# Patient Record
Sex: Male | Born: 1980 | Hispanic: Yes | Marital: Single | Smoking: Never smoker
Health system: Southern US, Community
[De-identification: ages and names within clinical notes are randomized; demographics above are authoritative.]

## PROBLEM LIST (undated history)

## (undated) DIAGNOSIS — E119 Type 2 diabetes mellitus without complications: Secondary | ICD-10-CM

---

## 2006-12-08 ENCOUNTER — Emergency Department: Payer: Self-pay | Admitting: Emergency Medicine

## 2009-01-22 ENCOUNTER — Emergency Department: Payer: Self-pay | Admitting: Emergency Medicine

## 2009-01-26 ENCOUNTER — Emergency Department: Payer: Self-pay | Admitting: Internal Medicine

## 2014-08-01 ENCOUNTER — Ambulatory Visit: Payer: Self-pay | Admitting: Physician Assistant

## 2014-08-01 LAB — CBC WITH DIFFERENTIAL/PLATELET
BASOS ABS: 0 10*3/uL (ref 0.0–0.1)
Basophil %: 0.7 %
Eosinophil #: 0.1 10*3/uL (ref 0.0–0.7)
Eosinophil %: 1.3 %
HCT: 48.3 % (ref 40.0–52.0)
HGB: 16.8 g/dL (ref 13.0–18.0)
Lymphocyte #: 1.7 10*3/uL (ref 1.0–3.6)
Lymphocyte %: 34.1 %
MCH: 29.5 pg (ref 26.0–34.0)
MCHC: 34.7 g/dL (ref 32.0–36.0)
MCV: 85 fL (ref 80–100)
Monocyte #: 0.4 x10 3/mm (ref 0.2–1.0)
Monocyte %: 6.9 %
NEUTROS PCT: 57 %
Neutrophil #: 2.9 10*3/uL (ref 1.4–6.5)
Platelet: 188 10*3/uL (ref 150–440)
RBC: 5.68 10*6/uL (ref 4.40–5.90)
RDW: 12.3 % (ref 11.5–14.5)
WBC: 5.1 10*3/uL (ref 3.8–10.6)

## 2014-08-01 LAB — COMPREHENSIVE METABOLIC PANEL
ALBUMIN: 4.6 g/dL (ref 3.4–5.0)
ALT: 121 U/L — AB
Alkaline Phosphatase: 183 U/L — ABNORMAL HIGH
Anion Gap: 12 (ref 7–16)
BUN: 15 mg/dL (ref 7–18)
Bilirubin,Total: 0.6 mg/dL (ref 0.2–1.0)
CALCIUM: 9.7 mg/dL (ref 8.5–10.1)
CHLORIDE: 98 mmol/L (ref 98–107)
CO2: 26 mmol/L (ref 21–32)
CREATININE: 1.06 mg/dL (ref 0.60–1.30)
EGFR (Non-African Amer.): >60
Glucose: 359 mg/dL — ABNORMAL HIGH (ref 65–99)
Osmolality: 287 (ref 275–301)
Potassium: 3.7 mmol/L (ref 3.5–5.1)
SGOT(AST): 53 U/L — ABNORMAL HIGH (ref 15–37)
Sodium: 136 mmol/L (ref 136–145)
Total Protein: 8.9 g/dL — ABNORMAL HIGH (ref 6.4–8.2)

## 2014-08-01 LAB — URINALYSIS, COMPLETE
BACTERIA: NEGATIVE
Bilirubin,UR: NEGATIVE
Blood: NEGATIVE
Glucose,UR: 500
Ketone: 15
LEUKOCYTE ESTERASE: NEGATIVE
Nitrite: NEGATIVE
PH: 5 (ref 5.0–8.0)
SPECIFIC GRAVITY: 1.01 (ref 1.000–1.030)

## 2014-08-01 LAB — HEMOGLOBIN A1C: HEMOGLOBIN A1C: 9.6 % — AB (ref 4.2–6.3)

## 2016-11-12 ENCOUNTER — Emergency Department
Admission: EM | Admit: 2016-11-12 | Discharge: 2016-11-12 | Disposition: A | Discharge status: Home / Self Care | Payer: Self-pay | Attending: Student in an Organized Health Care Education/Training Program | Admitting: Student in an Organized Health Care Education/Training Program

## 2016-11-12 ENCOUNTER — Encounter: Payer: Self-pay | Admitting: Emergency Medicine

## 2016-11-12 DIAGNOSIS — R739 Hyperglycemia, unspecified: Secondary | ICD-10-CM

## 2016-11-12 DIAGNOSIS — E1165 Type 2 diabetes mellitus with hyperglycemia: Secondary | ICD-10-CM | POA: Insufficient documentation

## 2016-11-12 HISTORY — DX: Type 2 diabetes mellitus without complications: E11.9

## 2016-11-12 LAB — CBC
HCT: 48.7 % (ref 40.0–52.0)
Hemoglobin: 16.9 g/dL (ref 13.0–18.0)
MCH: 29.5 pg (ref 26.0–34.0)
MCHC: 34.8 g/dL (ref 32.0–36.0)
MCV: 85 fL (ref 80.0–100.0)
PLATELETS: 207 10*3/uL (ref 150–440)
RBC: 5.73 MIL/uL (ref 4.40–5.90)
RDW: 12.8 % (ref 11.5–14.5)
WBC: 5.4 10*3/uL (ref 3.8–10.6)

## 2016-11-12 LAB — BASIC METABOLIC PANEL
Anion gap: 10 (ref 5–15)
BUN: 14 mg/dL (ref 6–20)
CO2: 25 mmol/L (ref 22–32)
CREATININE: 0.8 mg/dL (ref 0.61–1.24)
Calcium: 10 mg/dL (ref 8.9–10.3)
Chloride: 101 mmol/L (ref 101–111)
GFR calc Af Amer: >60 mL/min (ref >60)
GFR calc non Af Amer: >60 mL/min (ref >60)
GLUCOSE: 290 mg/dL — AB (ref 65–99)
Potassium: 3.7 mmol/L (ref 3.5–5.1)
Sodium: 136 mmol/L (ref 135–145)

## 2016-11-12 LAB — URINALYSIS, COMPLETE (UACMP) WITH MICROSCOPIC
Bacteria, UA: NONE SEEN
Bilirubin Urine: NEGATIVE
Hgb urine dipstick: NEGATIVE
Ketones, ur: 20 mg/dL — AB
Leukocytes, UA: NEGATIVE
Nitrite: NEGATIVE
PH: 5 (ref 5.0–8.0)
Protein, ur: 30 mg/dL — AB
SPECIFIC GRAVITY, URINE: 1.038 — AB (ref 1.005–1.030)
Squamous Epithelial / LPF: NONE SEEN

## 2016-11-12 LAB — GLUCOSE, CAPILLARY
Glucose-Capillary: 296 mg/dL — ABNORMAL HIGH (ref 65–99)
Glucose-Capillary: 187 mg/dL — ABNORMAL HIGH (ref 65–99)

## 2016-11-12 MED ORDER — SODIUM CHLORIDE 0.9 % IV BOLUS (SEPSIS)
1000.0000 mL | Freq: Once | INTRAVENOUS | Status: AC
  Administered 2016-11-12: 1000 mL via INTRAVENOUS

## 2016-11-12 NOTE — ED Triage Notes (Signed)
Pt to ed with c/o weight loss and dizziness and increased urinary frequency.

## 2016-11-12 NOTE — ED Notes (Signed)
CBG 187 

## 2016-11-12 NOTE — ED Provider Notes (Signed)
[facility] Emergency Department Provider Note    First MD Initiated Contact with Patient 11/12/16 1217     (approximate)  I have reviewed the triage vital signs and the nursing notes.   HISTORY  Chief Complaint Hyperglycemia    HPI Nathan Montoya is a 36 y.o. male with history of diabetes on metformin and glipizide presents with a month and a half of worsening urinary frequency increasing glucose measurements at home. States he feels dehydrated. Denies any burning when he urinates. No measured fevers. No nausea or vomiting. States he is intermittently having muscle cramps. No chest pain or shortness of breath. States she's been taking his medication as directed. He has a follow-up [facility] on the 29th of this month   Past Medical History:  Diagnosis Date  . Diabetes mellitus without complication (HCC)    History reviewed. No pertinent family history. History reviewed. No pertinent surgical history. There are no active problems to display for this patient.     Prior to Admission medications   Not on File    Allergies Patient has no known allergies.    Social History Social History  Substance Use Topics  . Smoking status: Never Smoker  . Smokeless tobacco: Never Used  . Alcohol use No    Review of Systems Patient denies headaches, rhinorrhea, blurry vision, numbness, shortness of breath, chest pain, edema, cough, abdominal pain, nausea, vomiting, diarrhea, dysuria, fevers, rashes or hallucinations unless otherwise stated above in HPI. ____________________________________________   PHYSICAL EXAM:  VITAL SIGNS: Vitals:   11/12/16 1231 11/12/16 1347  BP: 127/87 115/78  Pulse: 79 80  Resp: 17 17  Temp:      Constitutional: Alert and oriented. Well appearing and in no acute distress. Eyes: Conjunctivae are normal. PERRL. EOMI. Head: Atraumatic. Nose: No congestion/rhinnorhea. Mouth/Throat: Mucous membranes are  moist.  Oropharynx non-erythematous. Neck: No stridor. Painless ROM. No cervical spine tenderness to palpation Hematological/Lymphatic/Immunilogical: No cervical lymphadenopathy. Cardiovascular: Normal rate, regular rhythm. Grossly normal heart sounds.  Good peripheral circulation. Respiratory: Normal respiratory effort.  No retractions. Lungs CTAB. Gastrointestinal: Soft and nontender. No distention. No abdominal bruits. No CVA tenderness. Genitourinary:  Musculoskeletal: No lower extremity tenderness nor edema.  No joint effusions. Neurologic:  Normal speech and language. No gross focal neurologic deficits are appreciated. No gait instability. Skin:  Skin is warm, dry and intact. No rash noted. Psychiatric: Mood and affect are normal. Speech and behavior are normal.  ____________________________________________   LABS (all labs ordered are listed, but only abnormal results are displayed)  Results for orders placed or performed during the hospital encounter of 11/12/16 (from the past 24 hour(s))  Glucose, capillary     Status: Abnormal   Collection Time: 11/12/16  9:27 AM  Result Value Ref Range   Glucose-Capillary 296 (H) 65 - 99 mg/dL  Basic metabolic panel     Status: Abnormal   Collection Time: 11/12/16  9:28 AM  Result Value Ref Range   Sodium 136 135 - 145 mmol/L   Potassium 3.7 3.5 - 5.1 mmol/L   Chloride 101 101 - 111 mmol/L   CO2 25 22 - 32 mmol/L   Glucose, Bld 290 (H) 65 - 99 mg/dL   BUN 14 6 - 20 mg/dL   Creatinine, Ser 1.61 0.61 - 1.24 mg/dL   Calcium 09.6 8.9 - 04.5 mg/dL   GFR calc non Af Amer >60 >60 mL/min   GFR calc Af Amer >60 >60 mL/min  Anion gap 10 5 - 15  CBC     Status: None   Collection Time: 11/12/16  9:28 AM  Result Value Ref Range   WBC 5.4 3.8 - 10.6 K/uL   RBC 5.73 4.40 - 5.90 MIL/uL   Hemoglobin 16.9 13.0 - 18.0 g/dL   HCT 16.1 09.6 - 04.5 %   MCV 85.0 80.0 - 100.0 fL   MCH 29.5 26.0 - 34.0 pg   MCHC 34.8 32.0 - 36.0 g/dL   RDW 40.9 81.1  - 91.4 %   Platelets 207 150 - 440 K/uL  Urinalysis, Complete w Microscopic     Status: Abnormal   Collection Time: 11/12/16  9:29 AM  Result Value Ref Range   Color, Urine YELLOW (A) YELLOW   APPearance CLEAR (A) CLEAR   Specific Gravity, Urine 1.038 (H) 1.005 - 1.030   pH 5.0 5.0 - 8.0   Glucose, UA >=500 (A) NEGATIVE mg/dL   Hgb urine dipstick NEGATIVE NEGATIVE   Bilirubin Urine NEGATIVE NEGATIVE   Ketones, ur 20 (A) NEGATIVE mg/dL   Protein, ur 30 (A) NEGATIVE mg/dL   Nitrite NEGATIVE NEGATIVE   Leukocytes, UA NEGATIVE NEGATIVE   RBC / HPF 0-5 0 - 5 RBC/hpf   WBC, UA 0-5 0 - 5 WBC/hpf   Bacteria, UA NONE SEEN NONE SEEN   Squamous Epithelial / LPF NONE SEEN NONE SEEN   Mucous PRESENT   Blood gas, venous     Status: Abnormal (Preliminary result)   Collection Time: 11/12/16 12:51 PM  Result Value Ref Range   FIO2 PENDING    Delivery systems PENDING    pH, Ven 7.31 7.250 - 7.430   pCO2, Ven 57 44.0 - 60.0 mmHg   pO2, Ven PENDING 32.0 - 45.0 mmHg   Bicarbonate 28.7 (H) 20.0 - 28.0 mmol/L   Acid-Base Excess 1.2 0.0 - 2.0 mmol/L   O2 Saturation PENDING %   Patient temperature 37.0    Collection site VEIN    Sample type VEIN   Glucose, capillary     Status: Abnormal   Collection Time: 11/12/16  1:27 PM  Result Value Ref Range   Glucose-Capillary 187 (H) 65 - 99 mg/dL   ____________________________________________  ____________________________________________  RADIOLOGY   ____________________________________________   PROCEDURES  Procedure(s) performed:  Procedures    Critical Care performed: no ____________________________________________   INITIAL IMPRESSION / ASSESSMENT AND PLAN / ED COURSE  Pertinent labs & imaging results that were available during my care of the patient were reviewed by me and considered in my medical decision making (see chart for details).  DDX: hyperglycemia, DKA, HHNS, dehydration, sepsis  Nathan Montoya is a 36 y.o. who  presents to the ED with 36 y.o. male with Hx of DM p/w hyperglycemia. Hx of DM.Marland Kitchen Afebrile in ED. Exam as above. Getting labs for possible DKA, hydration, reassess. No clear clinical signs of infection. No focal neuro deficits or AMS.  Treatments will include observation, labs, UA, IV hydration.   Clinical Course as of Nov 12 2202  Tue Nov 12, 2016  1329 CBG 120.  Bicarb is normal. AG is normal. Electrolytes without acute abnormality. Clinical picture is consistent with hyperglycemia and not with DKA or HHS. Repeat FSBS improved.  [PR]  1338 VBG without evidence of acidosis.  No evidence of DKA.  [PR]  1408 PAtient assymptomatic.  Stable for continued outpatient follow up.  Patient was able to tolerate PO and was able to ambulate with a steady gait.  Have discussed with the patient and available family all diagnostics and treatments performed thus far and all questions were answered to the best of my ability. The patient demonstrates understanding and agreement with plan.   [PR]    Clinical Course User Index [PR] Willy EddyPatrick Audree Schrecengost, MD     ____________________________________________   FINAL CLINICAL IMPRESSION(S) / ED DIAGNOSES  Final diagnoses:  Hyperglycemia      NEW MEDICATIONS STARTED DURING THIS VISIT:  There are no discharge medications for this patient.    Note:  This document was prepared using Dragon voice recognition software and may include unintentional dictation errors.    Willy EddyPatrick Anntonette Madewell, MD 11/12/16 [phone]

## 2016-11-12 NOTE — ED Notes (Signed)
Electronic signature pad not working at this time. All questions answered. Patient verbalizes understanding of discharge instructions and follow up care.

## 2016-11-14 LAB — BLOOD GAS, VENOUS
ACID-BASE EXCESS: 1.2 mmol/L (ref 0.0–2.0)
BICARBONATE: 28.7 mmol/L — AB (ref 20.0–28.0)
PCO2 VEN: 57 mmHg (ref 44.0–60.0)
Patient temperature: 37
pH, Ven: 7.31 (ref 7.250–7.430)

## 2017-04-01 ENCOUNTER — Emergency Department
Admission: EM | Admit: 2017-04-01 | Discharge: 2017-04-01 | Disposition: A | Discharge status: Home / Self Care | Payer: Self-pay | Attending: Emergency Medicine | Admitting: Emergency Medicine

## 2017-04-01 ENCOUNTER — Encounter: Payer: Self-pay | Admitting: Emergency Medicine

## 2017-04-01 DIAGNOSIS — E119 Type 2 diabetes mellitus without complications: Secondary | ICD-10-CM | POA: Insufficient documentation

## 2017-04-01 DIAGNOSIS — Z7984 Long term (current) use of oral hypoglycemic drugs: Secondary | ICD-10-CM | POA: Insufficient documentation

## 2017-04-01 DIAGNOSIS — L02411 Cutaneous abscess of right axilla: Secondary | ICD-10-CM | POA: Insufficient documentation

## 2017-04-01 MED ORDER — LIDOCAINE HCL (PF) 1 % IJ SOLN
5.0000 mL | Freq: Once | INTRAMUSCULAR | Status: AC
  Administered 2017-04-01: 5 mL
  Filled 2017-04-01: qty 5

## 2017-04-01 MED ORDER — CEPHALEXIN 500 MG PO CAPS: 500.0000 mg | ORAL_CAPSULE | Freq: Four times a day (QID) | ORAL | 0 refills | Status: AC

## 2017-04-01 NOTE — ED Provider Notes (Signed)
[facility] Emergency Department Provider Note   ____________________________________________   I have reviewed the triage vital signs and the nursing notes.   HISTORY  Chief Complaint Abscess    HPI Nathan Montoya is a 36 y.o. male presents with right axilla possible abscess that developed 5 days ago. Patient unsure of origin of how developed. He hasn't he generally does not shave axilla hair and he has not attempted to express any drainage from the wound area. Patient did trim the axilla hair to come to the emergency department today. Patient does not have a history of abscesses and he is otherwise healthy. Patient denies fever, chills, headache, vision changes, chest pain, chest tightness, shortness of breath, abdominal pain, nausea and vomiting.  Past Medical History:  Diagnosis Date  . Diabetes mellitus without complication (HCC)     There are no active problems to display for this patient.   History reviewed. No pertinent surgical history.  Prior to Admission medications   Medication Sig Start Date End Date Taking? Authorizing Provider  metFORMIN (GLUCOPHAGE) 500 MG tablet Take 500 mg by mouth 2 (two) times daily with a meal.   Yes [provider]  cephALEXin (KEFLEX) 500 MG capsule Take 1 capsule (500 mg total) by mouth 4 (four) times daily. 04/01/17 04/08/17  Nayali Talerico, Jordan Likes, PA-C    Allergies Patient has no known allergies.  No family history on file.  Social History Social History  Substance Use Topics  . Smoking status: Never Smoker  . Smokeless tobacco: Never Used  . Alcohol use No    Review of Systems Constitutional: Negative for fever/chills Eyes: No visual changes. ENT:  Negative for sore throat and for difficulty swallowing Cardiovascular: Denies chest pain. Respiratory: Denies cough Denies shortness of breath. Gastrointestinal: No abdominal pain.  No nausea, vomiting, diarrhea. Musculoskeletal: Negative for  back pain. Negative for generalized body aches. Skin:Negative for rash. Left axilla possible abscess with pain and erythema. ____________________________________________   PHYSICAL EXAM:  VITAL SIGNS: ED Triage Vitals  Enc Vitals Group     BP 04/01/17 0903 124/81     Pulse Rate 04/01/17 0903 88     Resp 04/01/17 0903 20     Temp 04/01/17 0904 98.3 F (36.8 C)     Temp Source 04/01/17 0903 Oral     SpO2 04/01/17 0903 98 %     Weight 04/01/17 0903 178 lb (80.7 kg)     Height 04/01/17 0903 5\' 7"  (1.702 m)     Head Circumference --      Peak Flow --      Pain Score 04/01/17 0915 10     Pain Loc --      Pain Edu? --      Excl. in GC? --     Constitutional: Alert and oriented. Well appearing and in no acute distress.  Head: Normocephalic and atraumatic. Eyes: Conjunctivae are normal.  Mouth/Throat: Mucous membranes are moist. Neck: Supple.  Hematological/Lymphatic/Immunological: No axilla lymphadenopathy. Cardiovascular: Normal rate, regular rhythm. Normal distal pulses. Respiratory: Normal respiratory effort.  Musculoskeletal: Nontender with normal range of motion in all extremities. Neurologic: Normal speech and language.  Skin:  Skin is warm, dry and intact. No rash noted. Left axilla infect infective folliculitis with induration approximately 2 cm in diameter and erythema approximately 3.5 cm in diameter Psychiatric: Mood and affect are normal.  ____________________________________________   LABS (all labs ordered are listed, but only abnormal results are displayed)  Labs Reviewed - No  data to display ____________________________________________  EKG None ____________________________________________  RADIOLOGY None ____________________________________________   PROCEDURES  Procedure(s) performed:  INCISION AND DRAINAGE Performed by: Jordan Likesraci M Latia Mataya Consent: Verbal consent obtained. Risks and benefits: risks, benefits and alternatives were discussed Type:  abscess  Body area: Left axilla  Anesthesia: local infiltration  Incision was made with a scalpel.  Local anesthetic: lidocaine 1%   Anesthetic total: 2 ml  Complexity: complex Blunt dissection to break up loculations  Drainage: purulent  Drainage amount: 2 mL   Packing material: 1/4 in iodoform gauze  Patient tolerance: Patient tolerated the procedure well with no immediate complications.   Critical Care performed: no ____________________________________________   INITIAL IMPRESSION / ASSESSMENT AND PLAN / ED COURSE  Pertinent labs & imaging results that were available during my care of the patient were reviewed by me and considered in my medical decision making (see chart for details).  Presents with left axilla infective folliculitis. Physical exam findings are reassuring for impression. Patient's vital signs are stable and patient has been afebrile. Patient tolerated I&D procedure without complications. Reviewed dressing changes with patient and informed him that he needed to return for a wound recheck in 2 days patient verbalized understanding. Patient prescribed cephalexin for antibiotic coverage. Patient informed of clinical course, understand medical decision-making process, and agree with plan.  Patient was advised to return to the emergency department for symptoms that change or worsen.      ____________________________________________   FINAL CLINICAL IMPRESSION(S) / ED DIAGNOSES  Final diagnoses:  Abscess of axilla, right       NEW MEDICATIONS STARTED DURING THIS VISIT:  Discharge Medication List as of 04/01/2017 11:26 AM    START taking these medications   Details  cephALEXin (KEFLEX) 500 MG capsule Take 1 capsule (500 mg total) by mouth 4 (four) times daily., Starting Tue 04/01/2017, Until Tue 04/08/2017, Print         Note:  This document was prepared using Dragon voice recognition software and may include unintentional dictation errors.     Clois ComberLittle, Lynnel Zanetti M, PA-C 04/01/17 1312    Ambry Dix, Jordan Likesraci M, PA-C 04/01/17 1323    Jene EveryKinner, Robert, MD 04/01/17 1409

## 2017-04-01 NOTE — ED Notes (Signed)
See triage note. States he developed a possible abscess area under right arm about 5 days ago

## 2017-04-01 NOTE — ED Triage Notes (Signed)
Pt with boil to right axilla

## 2017-04-01 NOTE — Discharge Instructions (Signed)
Return for wound recheck in 2 days.

## 2017-04-03 ENCOUNTER — Encounter: Payer: Self-pay | Admitting: *Deleted

## 2017-04-03 ENCOUNTER — Emergency Department
Admission: EM | Admit: 2017-04-03 | Discharge: 2017-04-03 | Disposition: A | Discharge status: Home / Self Care | Payer: Self-pay | Attending: Emergency Medicine | Admitting: Emergency Medicine

## 2017-04-03 DIAGNOSIS — E119 Type 2 diabetes mellitus without complications: Secondary | ICD-10-CM | POA: Insufficient documentation

## 2017-04-03 DIAGNOSIS — Z4801 Encounter for change or removal of surgical wound dressing: Secondary | ICD-10-CM | POA: Insufficient documentation

## 2017-04-03 DIAGNOSIS — Z7984 Long term (current) use of oral hypoglycemic drugs: Secondary | ICD-10-CM | POA: Insufficient documentation

## 2017-04-03 DIAGNOSIS — Z5189 Encounter for other specified aftercare: Secondary | ICD-10-CM

## 2017-04-03 NOTE — ED Triage Notes (Addendum)
Pt arrives for wound check to right armpit, states he has not taken abx that was prescribed, denies any pain, interpreter used

## 2017-04-03 NOTE — ED Provider Notes (Signed)
[facility] Emergency Department Provider Note  ____________________________________________  Time seen: Approximately 10:35 AM  I have reviewed the triage vital signs and the nursing notes.   HISTORY  Chief Complaint Wound Check    HPI Nathan Montoya is a 36 y.o. male that presents to the emergency department for wound recheck. Patient had an abscess drained 2 days ago in right armpit. He is here to have packing removed. He has seen some clear drainage but no purulent drainage. It is no longer tender to palpation. He was never able to get his antibiotics filled. He denies fever, shortness of breath, chest pain, nausea, vomiting, abdominal pain.   Past Medical History:  Diagnosis Date  . Diabetes mellitus without complication (HCC)     There are no active problems to display for this patient.   History reviewed. No pertinent surgical history.  Prior to Admission medications   Medication Sig Start Date End Date Taking? Authorizing Provider  cephALEXin (KEFLEX) 500 MG capsule Take 1 capsule (500 mg total) by mouth 4 (four) times daily. 04/01/17 04/08/17  Little, Traci M, PA-C  metFORMIN (GLUCOPHAGE) 500 MG tablet Take 500 mg by mouth 2 (two) times daily with a meal.    [provider]    Allergies Patient has no known allergies.  History reviewed. No pertinent family history.  Social History Social History  Substance Use Topics  . Smoking status: Never Smoker  . Smokeless tobacco: Never Used  . Alcohol use No     Review of Systems  Constitutional: No fever/chills Cardiovascular: No chest pain. Respiratory: No SOB. Gastrointestinal: No abdominal pain.  No nausea, no vomiting.  Musculoskeletal: Negative for musculoskeletal pain. Skin: Negative for abrasions, lacerations, ecchymosis. Neurological: Negative for headaches, numbness or tingling   ____________________________________________   PHYSICAL EXAM:  VITAL SIGNS: ED  Triage Vitals  Enc Vitals Group     BP 04/03/17 0937 (!) 127/91     Pulse Rate 04/03/17 0937 86     Resp 04/03/17 0937 16     Temp 04/03/17 0937 98.2 F (36.8 C)     Temp Source 04/03/17 0937 Oral     SpO2 04/03/17 0937 99 %     Weight 04/03/17 0934 178 lb (80.7 kg)     Height 04/03/17 0934 5\' 7"  (1.702 m)     Head Circumference --      Peak Flow --      Pain Score 04/03/17 0933 0     Pain Loc --      Pain Edu? --      Excl. in GC? --      Constitutional: Alert and oriented. Well appearing and in no acute distress. Eyes: Conjunctivae are normal. PERRL. EOMI. Head: Atraumatic. ENT:      Ears:      Nose: No congestion/rhinnorhea.      Mouth/Throat: Mucous membranes are moist.  Neck: No stridor.  Cardiovascular: Normal rate, regular rhythm.  Good peripheral circulation. Respiratory: Normal respiratory effort without tachypnea or retractions. Lungs CTAB. Good air entry to the bases with no decreased or absent breath sounds. Gastrointestinal: Bowel sounds 4 quadrants. Soft and nontender to palpation. No guarding or rigidity. No palpable masses. No distention. Musculoskeletal: Full range of motion to all extremities. No gross deformities appreciated. Neurologic:  Normal speech and language. No gross focal neurologic deficits are appreciated.  Skin:  Skin is warm, dry. 1 cm x 1 cm area of induration and erythema in right axilla. Nontender to palpation.  Packing in place. No drainage.   ____________________________________________   LABS (all labs ordered are listed, but only abnormal results are displayed)  Labs Reviewed - No data to display ____________________________________________  EKG   ____________________________________________  RADIOLOGY  No results found.  ____________________________________________    PROCEDURES  Procedure(s) performed:    Procedures  Packing material was removed with Adsens.  Medications - No data to  display   ____________________________________________   INITIAL IMPRESSION / ASSESSMENT AND PLAN / ED COURSE  Pertinent labs & imaging results that were available during my care of the patient were reviewed by me and considered in my medical decision making (see chart for details).  Review of the [place] CSRS was performed in accordance of the [facility] prior to dispensing any controlled drugs.   Patient presented to the emergency department for wound recheck. Vital signs and exam are reassuring. Abscess appears well and is not actively draining so I do not think repacking is necessary. Packing material was removed. There is still an area of induration and erythema so patient is going to fill his antibiotics today. Patient is to follow up with PCP as directed. Patient is given ED precautions to return to the ED for any worsening or new symptoms.   ____________________________________________  FINAL CLINICAL IMPRESSION(S) / ED DIAGNOSES  Final diagnoses:  Wound check, abscess      NEW MEDICATIONS STARTED DURING THIS VISIT:  Discharge Medication List as of 04/03/2017 10:42 AM          This chart was dictated using voice recognition software/Dragon. Despite best efforts to proofread, errors can occur which can change the meaning. Any change was purely unintentional.    Enid Derry, PA-C 04/03/17 1259    Enid Derry, PA-C 04/03/17 1259    Sharman Cheek, MD 04/05/17 2022

## 2017-04-03 NOTE — ED Notes (Signed)
See triage note  States he is here for wound recheck  Had abscess area lanced 2 days ago

## 2021-02-17 ENCOUNTER — Emergency Department
Admission: EM | Admit: 2021-02-17 | Discharge: 2021-02-17 | Disposition: A | Discharge status: Home / Self Care | Payer: Self-pay | Attending: Emergency Medicine | Admitting: Emergency Medicine

## 2021-02-17 ENCOUNTER — Encounter: Payer: Self-pay | Admitting: Emergency Medicine

## 2021-02-17 ENCOUNTER — Other Ambulatory Visit: Payer: Self-pay

## 2021-02-17 DIAGNOSIS — R739 Hyperglycemia, unspecified: Secondary | ICD-10-CM

## 2021-02-17 DIAGNOSIS — E1165 Type 2 diabetes mellitus with hyperglycemia: Secondary | ICD-10-CM | POA: Insufficient documentation

## 2021-02-17 LAB — CBC
HCT: 39.2 % (ref 39.0–52.0)
Hemoglobin: 14.2 g/dL (ref 13.0–17.0)
MCH: 31.8 pg (ref 26.0–34.0)
MCHC: 36.2 g/dL — ABNORMAL HIGH (ref 30.0–36.0)
MCV: 87.9 fL (ref 80.0–100.0)
Platelets: 196 10*3/uL (ref 150–400)
RBC: 4.46 MIL/uL (ref 4.22–5.81)
RDW: 10.9 % — ABNORMAL LOW (ref 11.5–15.5)
WBC: 4.1 10*3/uL (ref 4.0–10.5)
nRBC: 0 % (ref 0.0–0.2)

## 2021-02-17 LAB — BASIC METABOLIC PANEL
Anion gap: 10 (ref 5–15)
BUN: 18 mg/dL (ref 6–20)
CO2: 23 mmol/L (ref 22–32)
Calcium: 9.1 mg/dL (ref 8.9–10.3)
Chloride: 99 mmol/L (ref 98–111)
Creatinine, Ser: 0.64 mg/dL (ref 0.61–1.24)
GFR, Estimated: >60 mL/min (ref >60)
Glucose, Bld: 442 mg/dL — ABNORMAL HIGH (ref 70–99)
Potassium: 4.5 mmol/L (ref 3.5–5.1)
Sodium: 132 mmol/L — ABNORMAL LOW (ref 135–145)

## 2021-02-17 LAB — CBG MONITORING, ED
Glucose-Capillary: 466 mg/dL — ABNORMAL HIGH (ref 70–99)
Glucose-Capillary: 270 mg/dL — ABNORMAL HIGH (ref 70–99)

## 2021-02-17 MED ORDER — SODIUM CHLORIDE 0.9 % IV BOLUS
1000.0000 mL | Freq: Once | INTRAVENOUS | Status: AC
  Administered 2021-02-17: 1000 mL via INTRAVENOUS

## 2021-02-17 MED ORDER — METFORMIN HCL 850 MG PO TABS: 850.0000 mg | ORAL_TABLET | Freq: Two times a day (BID) | ORAL | 0 refills | Status: AC

## 2021-02-17 NOTE — ED Triage Notes (Signed)
Pt to ED via POV from [facility] with c/o hyperglycemia, per bloodwork done at [facility] pt BPD 427. CBG on arrival to ED 466. Pt A&O x4, ambulatory on arrival to ED.

## 2021-02-17 NOTE — ED Notes (Signed)
Purple top and light green top sent by this tech.

## 2021-02-17 NOTE — Discharge Instructions (Signed)
Deje de tomar su dosis actual de metformina (500 mg) y comience a tomar la dosis mas alta (850 mg) recetada hoy.

## 2021-02-17 NOTE — ED Notes (Signed)
Pt c/o general fatigue and headache r/t hyperglycemia. Pt reports taking Metformin daily as prescribed.  Pt also notes bilateral foot pain, a little diarrhea. Denies vomiting or urinary frequency.

## 2021-02-17 NOTE — ED Provider Notes (Signed)
[facility] Emergency Department Provider Note ____________________________________________   Event Date/Time   First MD Initiated Contact with Patient 02/17/21 1518     (approximate)  I have reviewed the triage vital signs and the nursing notes.   HISTORY  Chief Complaint Hyperglycemia  HPI and ROS obtained via video interpreter  HPI Nathan Montoya is a 40 y.o. male with history of diabetes on metformin who presents with fatigue and headache for the last 3 days, gradual onset, persistent course.  He states he did not check his sugar at home, but went to urgent care and was found to be in the 400s.  He denies any other pain, polyuria, or other acute symptoms.  He states that he has been compliant with his metformin but does not know his dose.   Past Medical History:  Diagnosis Date  . Diabetes mellitus without complication (HCC)     There are no problems to display for this patient.   History reviewed. No pertinent surgical history.  Prior to Admission medications   Medication Sig Start Date End Date Taking? Authorizing Provider  metFORMIN (GLUCOPHAGE) 850 MG tablet Take 1 tablet (850 mg total) by mouth 2 (two) times daily with a meal. 02/17/21 03/19/21 Yes Dionne Bucy, MD    Allergies Patient has no known allergies.  History reviewed. No pertinent family history.  Social History Social History   Tobacco Use  . Smoking status: Never Smoker  . Smokeless tobacco: Never Used  Substance Use Topics  . Alcohol use: No    Review of Systems  Constitutional: No fever.  Positive for fatigue. Eyes: No redness. ENT: No sore throat. Cardiovascular: Denies chest pain. Respiratory: Denies shortness of breath. Gastrointestinal: No vomiting or diarrhea.  Genitourinary: Negative for dysuria or polyuria.  Musculoskeletal: Negative for back pain. Skin: Negative for rash. Neurological: Positive for  headache.   ____________________________________________   PHYSICAL EXAM:  VITAL SIGNS: ED Triage Vitals  Enc Vitals Group     BP 02/17/21 1404 104/71     Pulse Rate 02/17/21 1404 (!) 102     Resp 02/17/21 1404 18     Temp 02/17/21 1404 97.9 F (36.6 C)     Temp Source 02/17/21 1404 Oral     SpO2 02/17/21 1404 100 %     Weight 02/17/21 1404 120 lb (54.4 kg)     Height 02/17/21 1404 5' 7.32" (1.71 m)     Head Circumference --      Peak Flow --      Pain Score 02/17/21 1411 5     Pain Loc --      Pain Edu? --      Excl. in GC? --     Constitutional: Alert and oriented. Well appearing and in no acute distress. Eyes: Conjunctivae are normal.  Head: Atraumatic. Nose: No congestion/rhinnorhea. Mouth/Throat: Mucous membranes are slightly dry. Neck: Normal range of motion.  Cardiovascular: Normal rate, regular rhythm.   Good peripheral circulation. Respiratory: Normal respiratory effort.  No retractions.  Gastrointestinal: Soft and nontender. No distention.  Genitourinary: No CVA tenderness. Musculoskeletal: No lower extremity edema.  Extremities warm and well perfused.  Neurologic:  Normal speech and language. No gross focal neurologic deficits are appreciated.  Skin:  Skin is warm and dry. No rash noted. Psychiatric: Mood and affect are normal. Speech and behavior are normal.  ____________________________________________   LABS (all labs ordered are listed, but only abnormal results are displayed)  Labs Reviewed  CBC - Abnormal;  Notable for the following components:      Result Value   MCHC 36.2 (*)    RDW 10.9 (*)    All other components within normal limits  BASIC METABOLIC PANEL - Abnormal; Notable for the following components:   Sodium 132 (*)    Glucose, Bld 442 (*)    All other components within normal limits  CBG MONITORING, ED - Abnormal; Notable for the following components:   Glucose-Capillary 466 (*)    All other components within normal limits  CBG  MONITORING, ED - Abnormal; Notable for the following components:   Glucose-Capillary 270 (*)    All other components within normal limits   ____________________________________________  EKG   ____________________________________________  RADIOLOGY    ____________________________________________   PROCEDURES  Procedure(s) performed: No  Procedures  Critical Care performed: No ____________________________________________   INITIAL IMPRESSION / ASSESSMENT AND PLAN / ED COURSE  Pertinent labs & imaging results that were available during my care of the patient were reviewed by me and considered in my medical decision making (see chart for details).  40 year old male with a history of diabetes on metformin presents with fatigue and mild headache over the last several days and was found to be hyperglycemic to the 400s.  He denies any other acute symptoms and reports being compliant with metformin.  On exam the patient is overall well-appearing.  His vital signs are normal.  The physical exam is otherwise unremarkable except for slightly dry mucous membranes.  Initial lab work-up reveals a glucose of 442 with a normal anion gap and bicarb.  There is no evidence of DKA.  It is somewhat unclear what precipitated the patient's hyperglycemia given his reported medication compliance.  There is no evidence of acute infection or other precipitating cause.  We will treat with IV fluids and consider increasing his dose of metformin.  ----------------------------------------- 5:12 PM on 02/17/2021 -----------------------------------------  The glucose is now in the 200s.  The patient appears comfortable and is stable for discharge home.  I counseled him on the results of the work-up via video interpreter.  I have prescribed 850 mg twice daily of metformin and I instructed him to discontinue the 500 mg and start the higher dose until he can follow-up with his primary care provider at [facility]  Return precautions given, and he expressed understanding.  ____________________________________________   FINAL CLINICAL IMPRESSION(S) / ED DIAGNOSES  Final diagnoses:  Hyperglycemia      NEW MEDICATIONS STARTED DURING THIS VISIT:  New Prescriptions   METFORMIN (GLUCOPHAGE) 850 MG TABLET    Take 1 tablet (850 mg total) by mouth 2 (two) times daily with a meal.     Note:  This document was prepared using Dragon voice recognition software and may include unintentional dictation errors.   Dionne Bucy, MD 02/17/21 [phone]

## 2021-02-17 NOTE — ED Notes (Signed)
ED Provider at bedside. 

## 2021-02-17 NOTE — ED Notes (Signed)
ED Provider Siadecki at bedside. Video interpreter used.

## 2021-04-27 ENCOUNTER — Ambulatory Visit (INDEPENDENT_AMBULATORY_CARE_PROVIDER_SITE_OTHER): Payer: Self-pay

## 2021-04-27 ENCOUNTER — Other Ambulatory Visit: Payer: Self-pay

## 2021-04-27 ENCOUNTER — Ambulatory Visit
Admission: EM | Admit: 2021-04-27 | Discharge: 2021-04-27 | Disposition: A | Discharge status: Home / Self Care | Payer: Self-pay | Attending: Emergency Medicine | Admitting: Emergency Medicine

## 2021-04-27 DIAGNOSIS — G629 Polyneuropathy, unspecified: Secondary | ICD-10-CM | POA: Insufficient documentation

## 2021-04-27 DIAGNOSIS — M25562 Pain in left knee: Secondary | ICD-10-CM

## 2021-04-27 DIAGNOSIS — M25561 Pain in right knee: Secondary | ICD-10-CM

## 2021-04-27 LAB — GLUCOSE, CAPILLARY: Glucose-Capillary: 151 mg/dL — ABNORMAL HIGH (ref 70–99)

## 2021-04-27 MED ORDER — GABAPENTIN 100 MG PO CAPS: 100.0000 mg | ORAL_CAPSULE | Freq: Three times a day (TID) | ORAL | 1 refills | Status: AC

## 2021-04-27 NOTE — Discharge Instructions (Addendum)
Sus radiografas no mostraron huesos rotos o artritis.  Creo que su dolor y picazn provienen de la inflamacin de los nervios de su diabetes.  Contine con su metformina dos veces al da.  Agregue la gabapentina a la hora de acostarse para ayudar con la picazn y Chief Technology Officer.  Seguimiento con su mdico en la [facility] Doss.  Your X-rays did not show and broken bones or arthritis.  I believe your pain and itching are coming from nerve inflammation from your diabetes.  Continue your Metformin twice daily.  Add in the Gabapentin at bedtime to help with itching and pain.  Follow-up with your doctor at [facility].

## 2021-04-27 NOTE — ED Provider Notes (Signed)
[facility]    CSN: [id number] Arrival date & time: 04/27/21  1153      History   Chief Complaint Chief Complaint  Patient presents with   Knee Pain    Bilateral    Pruritis    HPI Nathan Montoya Elvera Lennox is a 40 y.o. male.   HPI  40 year old Spanish-speaking male here for evaluation of bilateral knee pain and whole body itching.  Patient reports that he has been experiencing pain in both of his knees that radiates down to the soles of his feet for the past 3 weeks and he has been experiencing whole body itching for 2 weeks.  He states that it affects his sleep because he feels it more at night.  He denies rash, new medications, new clothes, changes in personal hygiene or cleaning products.  He is never had anything like this before.  Patient also denies previous injury to his knees.  He does report that he works in [employer] and spends 12 hours on his feet each day.  He does report that he has had some whole leg swelling at the end of the day.  He is also complaining of numbness and tingling in his legs and burning in his feet.  Patient is a diabetic who is on metformin and is treated at [facility] but he has not been there in some time.  Was recently in the emergency department on February 17, 2021 and at that time his CMP showed a glucose of 442 with a fingerstick of 270.  No A1c on file.  Spanish interpreter Johnny Bridge [phone] utilized for HPI and physical exam.  Past Medical History:  Diagnosis Date   Diabetes mellitus without complication (HCC)     There are no problems to display for this patient.   History reviewed. No pertinent surgical history.     Home Medications    Prior to Admission medications   Medication Sig Start Date End Date Taking? Authorizing Provider  gabapentin (NEURONTIN) 100 MG capsule Take 1 capsule (100 mg total) by mouth 3 (three) times daily. Start with 1 capsule nightly for 2 weeks, then 1 capsule in the morning and at night for  2 weeks, and then increase to 1 capsule 3 times a day. 04/27/21 05/27/21 Yes Becky Augusta, NP  metFORMIN (GLUCOPHAGE) 850 MG tablet Take 1 tablet (850 mg total) by mouth 2 (two) times daily with a meal. 02/17/21 04/27/21 Yes Dionne Bucy, MD    Family History History reviewed. No pertinent family history.  Social History Social History   Tobacco Use   Smoking status: Never   Smokeless tobacco: Never  Substance Use Topics   Alcohol use: No     Allergies   Patient has no known allergies.   Review of Systems Review of Systems  Constitutional:  Negative for activity change, appetite change and fever.  Musculoskeletal:  Positive for arthralgias. Negative for joint swelling.  Skin:  Negative for rash.  Neurological:  Positive for numbness. Negative for weakness.  Hematological: Negative.   Psychiatric/Behavioral: Negative.      Physical Exam Triage Vital Signs ED Triage Vitals  Enc Vitals Group     BP 04/27/21 1222 108/87     Pulse Rate 04/27/21 1222 (!) 117     Resp 04/27/21 1222 18     Temp 04/27/21 1222 98.5 F (36.9 C)     Temp Source 04/27/21 1222 Oral     SpO2 04/27/21 1222 100 %  Weight 04/27/21 1222 119 lb 14.9 oz (54.4 kg)     Height 04/27/21 1222 5' 7.3" (1.709 m)     Head Circumference --      Peak Flow --      Pain Score 04/27/21 1221 7     Pain Loc --      Pain Edu? --      Excl. in GC? --    No data found.  Updated Vital Signs BP 108/87 (BP Location: Left Arm)   Pulse (!) 117   Temp 98.5 F (36.9 C) (Oral)   Resp 18   Ht 5' 7.3" (1.709 m)   Wt 119 lb 14.9 oz (54.4 kg)   SpO2 100%   BMI 18.62 kg/m   Visual Acuity Right Eye Distance:   Left Eye Distance:   Bilateral Distance:    Right Eye Near:   Left Eye Near:    Bilateral Near:     Physical Exam Vitals and nursing note reviewed.  Constitutional:      General: He is not in acute distress.    Appearance: Normal appearance. He is normal weight. He is not ill-appearing.  HENT:      Head: Normocephalic and atraumatic.  Musculoskeletal:        General: Tenderness present. No swelling, deformity or signs of injury. Normal range of motion.     Right lower leg: No edema.     Left lower leg: No edema.  Skin:    General: Skin is warm and dry.     Capillary Refill: Capillary refill takes less than 2 seconds.     Findings: No bruising, erythema or rash.  Neurological:     General: No focal deficit present.     Mental Status: He is alert and oriented to person, place, and time.  Psychiatric:        Mood and Affect: Mood normal.        Behavior: Behavior normal.        Thought Content: Thought content normal.        Judgment: Judgment normal.     UC Treatments / Results  Labs (all labs ordered are listed, but only abnormal results are displayed) Labs Reviewed  GLUCOSE, CAPILLARY - Abnormal; Notable for the following components:      Result Value   Glucose-Capillary 151 (*)    All other components within normal limits  CBG MONITORING, ED    EKG   Radiology DG Knee Complete 4 Views Left  Result Date: 04/27/2021 CLINICAL DATA:  LEFT knee pain for 3 weeks. No known injury. Initial encounter. EXAM: LEFT KNEE - COMPLETE 4+ VIEW COMPARISON:  None. FINDINGS: No evidence of fracture, dislocation, or joint effusion. No evidence of arthropathy or other focal bone abnormality. Soft tissues are unremarkable. IMPRESSION: Negative. Electronically Signed   By: Harmon Pier M.D.   On: 04/27/2021 13:41   DG Knee Complete 4 Views Right  Result Date: 04/27/2021 CLINICAL DATA:  Knee pain for 3 weeks EXAM: RIGHT KNEE - COMPLETE 4+ VIEW COMPARISON:  None. FINDINGS: No evidence of fracture, dislocation, or joint effusion. No evidence of arthropathy or other focal bone abnormality. Soft tissues are unremarkable. IMPRESSION: Negative. Electronically Signed   By: Signa Kell M.D.   On: 04/27/2021 13:42    Procedures Procedures (including critical care time)  Medications Ordered in  UC Medications - No data to display  Initial Impression / Assessment and Plan / UC Course  I have reviewed the triage vital  signs and the nursing notes.  Pertinent labs & imaging results that were available during my care of the patient were reviewed by me and considered in my medical decision making (see chart for details).  Patient is a pleasant though nontoxic-appearing 40 year old male here for evaluation of bilateral knee pain and also whole body itching as outlined in HPI above.  Patient's physical exam reveals bilateral knees that are normal anatomical alignment.  Patient complains of tenderness to palpation of the knee globally as well as pain with varus and valgus stress application of both knees.  There is no edema, erythema, or ecchymosis noted.  DP and PT pulses are 2+ bilaterally.  In the absence of definitive injury will obtain bilateral knee films to evaluate for arthritis.  Suspect patient's whole body itching is a manifestation of neuropathy as his blood sugar does not appear to be well controlled and he has not seen his primary care provider in some time.  Bilateral knee films are negative per radiology.  CBGs 151.  Suspect patient's symptoms are all neuropathy related and will start patient on gabapentin 300 mg at bedtime and have him follow-up with his primary care provider at [facility].   Final Clinical Impressions(s) / UC Diagnoses   Final diagnoses:  Neuropathy     Discharge Instructions      Sus radiografas no mostraron huesos rotos o artritis.  Creo que su dolor y picazn provienen de la inflamacin de los nervios de su diabetes.  Contine con su metformina dos veces al da.  Agregue la gabapentina a la hora de acostarse para ayudar con la picazn y Chief Technology Officer.  Seguimiento con su mdico en la [facility].  Your X-rays did not show and broken bones or arthritis.  I believe your pain and itching are coming from nerve inflammation from  your diabetes.  Continue your Metformin twice daily.  Add in the Gabapentin at bedtime to help with itching and pain.  Follow-up with your doctor at [facility].      ED Prescriptions     Medication Sig Dispense Auth. Provider   gabapentin (NEURONTIN) 100 MG capsule Take 1 capsule (100 mg total) by mouth 3 (three) times daily. Start with 1 capsule nightly for 2 weeks, then 1 capsule in the morning and at night for 2 weeks, and then increase to 1 capsule 3 times a day. 90 capsule Becky Augusta, NP      PDMP not reviewed this encounter.   Becky Augusta, NP 04/27/21 1424

## 2021-04-27 NOTE — ED Triage Notes (Signed)
Patient states that he has bilateral knee pain that radiates down to feet x 3 weeks.   Patient also states that he has itching all over his entire body.

## 2021-05-27 ENCOUNTER — Encounter: Payer: Self-pay | Admitting: *Deleted

## 2021-05-27 ENCOUNTER — Other Ambulatory Visit: Payer: Self-pay

## 2021-05-27 DIAGNOSIS — I951 Orthostatic hypotension: Principal | ICD-10-CM | POA: Insufficient documentation

## 2021-05-27 DIAGNOSIS — Z7984 Long term (current) use of oral hypoglycemic drugs: Secondary | ICD-10-CM | POA: Insufficient documentation

## 2021-05-27 DIAGNOSIS — E119 Type 2 diabetes mellitus without complications: Secondary | ICD-10-CM | POA: Insufficient documentation

## 2021-05-27 DIAGNOSIS — U071 COVID-19: Secondary | ICD-10-CM | POA: Insufficient documentation

## 2021-05-27 DIAGNOSIS — E871 Hypo-osmolality and hyponatremia: Secondary | ICD-10-CM | POA: Insufficient documentation

## 2021-05-27 DIAGNOSIS — Z79899 Other long term (current) drug therapy: Secondary | ICD-10-CM | POA: Insufficient documentation

## 2021-05-27 NOTE — ED Triage Notes (Signed)
Pain in the both knees for about two months, no injury.  Has been dizzy for about 2 days, when he stands up. Feels like heart is beating fast. Diabetic, reports sugars have been in the 220's.

## 2021-05-28 ENCOUNTER — Observation Stay
Admission: EM | Admit: 2021-05-28 | Discharge: 2021-05-29 | Disposition: A | Discharge status: Home / Self Care | Payer: Self-pay | Attending: Internal Medicine | Admitting: Internal Medicine

## 2021-05-28 ENCOUNTER — Encounter: Payer: Self-pay | Admitting: Internal Medicine

## 2021-05-28 ENCOUNTER — Observation Stay
Admit: 2021-05-28 | Discharge: 2021-05-28 | Disposition: A | Discharge status: Home / Self Care | Payer: Self-pay | Attending: Internal Medicine | Admitting: Internal Medicine

## 2021-05-28 ENCOUNTER — Observation Stay: Payer: Self-pay

## 2021-05-28 DIAGNOSIS — E119 Type 2 diabetes mellitus without complications: Secondary | ICD-10-CM

## 2021-05-28 DIAGNOSIS — U071 COVID-19: Secondary | ICD-10-CM

## 2021-05-28 DIAGNOSIS — E871 Hypo-osmolality and hyponatremia: Secondary | ICD-10-CM

## 2021-05-28 DIAGNOSIS — R55 Syncope and collapse: Secondary | ICD-10-CM

## 2021-05-28 DIAGNOSIS — I951 Orthostatic hypotension: Secondary | ICD-10-CM | POA: Diagnosis present

## 2021-05-28 LAB — ECHOCARDIOGRAM COMPLETE
AR max vel: 2.49 cm2
AV Area VTI: 2.97 cm2
AV Area mean vel: 2.71 cm2
AV Mean grad: 2 mmHg
AV Peak grad: 4.2 mmHg
Ao pk vel: 1.02 m/s
Area-P 1/2: 8.25 cm2
MV VTI: 2.32 cm2
S' Lateral: 2.6 cm
Weight: 1918.88 oz

## 2021-05-28 LAB — BASIC METABOLIC PANEL
Anion gap: 10 (ref 5–15)
Anion gap: 7 (ref 5–15)
BUN: 24 mg/dL — ABNORMAL HIGH (ref 6–20)
BUN: 17 mg/dL (ref 6–20)
CO2: 25 mmol/L (ref 22–32)
CO2: 25 mmol/L (ref 22–32)
Calcium: 9.2 mg/dL (ref 8.9–10.3)
Calcium: 8.3 mg/dL — ABNORMAL LOW (ref 8.9–10.3)
Chloride: 93 mmol/L — ABNORMAL LOW (ref 98–111)
Chloride: 99 mmol/L (ref 98–111)
Creatinine, Ser: 1.07 mg/dL (ref 0.61–1.24)
Creatinine, Ser: 0.67 mg/dL (ref 0.61–1.24)
GFR, Estimated: >60 mL/min (ref >60)
GFR, Estimated: >60 mL/min (ref >60)
Glucose, Bld: 134 mg/dL — ABNORMAL HIGH (ref 70–99)
Glucose, Bld: 89 mg/dL (ref 70–99)
Potassium: 3.4 mmol/L — ABNORMAL LOW (ref 3.5–5.1)
Potassium: 3.8 mmol/L (ref 3.5–5.1)
Sodium: 128 mmol/L — ABNORMAL LOW (ref 135–145)
Sodium: 131 mmol/L — ABNORMAL LOW (ref 135–145)

## 2021-05-28 LAB — CBC
Hemoglobin: 13.8 g/dL (ref 13.0–17.0)
Platelets: 154 10*3/uL (ref 150–400)
WBC: 4.7 10*3/uL (ref 4.0–10.5)

## 2021-05-28 LAB — CBG MONITORING, ED: Glucose-Capillary: 123 mg/dL — ABNORMAL HIGH (ref 70–99)

## 2021-05-28 LAB — URINALYSIS, COMPLETE (UACMP) WITH MICROSCOPIC
Bilirubin Urine: NEGATIVE
Glucose, UA: NEGATIVE mg/dL
Hgb urine dipstick: NEGATIVE
Ketones, ur: NEGATIVE mg/dL
Leukocytes,Ua: NEGATIVE
Nitrite: NEGATIVE
Protein, ur: 30 mg/dL — AB
Specific Gravity, Urine: 1.009 (ref 1.005–1.030)
pH: 6 (ref 5.0–8.0)

## 2021-05-28 LAB — HEMOGLOBIN A1C
Hgb A1c MFr Bld: 6.4 % — ABNORMAL HIGH (ref 4.8–5.6)
Mean Plasma Glucose: 137 mg/dL

## 2021-05-28 LAB — RESP PANEL BY RT-PCR (FLU A&B, COVID) ARPGX2
Influenza A by PCR: NEGATIVE
Influenza B by PCR: NEGATIVE
SARS Coronavirus 2 by RT PCR: POSITIVE — AB

## 2021-05-28 LAB — TROPONIN I (HIGH SENSITIVITY): Troponin I (High Sensitivity): 4 ng/L (ref <18)

## 2021-05-28 LAB — TSH: TSH: 1.365 u[IU]/mL (ref 0.350–4.500)

## 2021-05-28 LAB — C-REACTIVE PROTEIN: CRP: 1.3 mg/dL — ABNORMAL HIGH (ref <1.0)

## 2021-05-28 LAB — CORTISOL: Cortisol, Plasma: 13.4 ug/dL

## 2021-05-28 LAB — GLUCOSE, CAPILLARY
Glucose-Capillary: 77 mg/dL (ref 70–99)
Glucose-Capillary: 123 mg/dL — ABNORMAL HIGH (ref 70–99)
Glucose-Capillary: 79 mg/dL (ref 70–99)
Glucose-Capillary: 123 mg/dL — ABNORMAL HIGH (ref 70–99)

## 2021-05-28 LAB — D-DIMER, QUANTITATIVE: D-Dimer, Quant: 0.75 ug/mL-FEU — ABNORMAL HIGH (ref 0.00–0.50)

## 2021-05-28 LAB — HIV ANTIBODY (ROUTINE TESTING W REFLEX): HIV Screen 4th Generation wRfx: NONREACTIVE

## 2021-05-28 MED ORDER — LACTATED RINGERS IV SOLN: INTRAVENOUS | Status: DC

## 2021-05-28 MED ORDER — LACTATED RINGERS IV BOLUS
1000.0000 mL | Freq: Once | INTRAVENOUS | Status: AC
  Administered 2021-05-28: 1000 mL via INTRAVENOUS

## 2021-05-28 MED ORDER — ACETAMINOPHEN 325 MG PO TABS
650.0000 mg | ORAL_TABLET | Freq: Four times a day (QID) | ORAL | Status: DC | PRN
  Administered 2021-05-29: 08:00:00 650 mg via ORAL
  Filled 2021-05-28: qty 2

## 2021-05-28 MED ORDER — SODIUM CHLORIDE 0.9 % IV BOLUS
1000.0000 mL | Freq: Once | INTRAVENOUS | Status: AC
  Administered 2021-05-28: 1000 mL via INTRAVENOUS

## 2021-05-28 MED ORDER — SODIUM CHLORIDE 0.9 % IV SOLN
200.0000 mg | Freq: Once | INTRAVENOUS | Status: AC
  Administered 2021-05-28: 10:00:00 200 mg via INTRAVENOUS
  Filled 2021-05-28: qty 200

## 2021-05-28 MED ORDER — INSULIN ASPART 100 UNIT/ML IJ SOLN
0.0000 [IU] | Freq: Three times a day (TID) | INTRAMUSCULAR | Status: DC
Start: 2021-05-28 — End: 2021-05-29
  Administered 2021-05-28 – 2021-05-29 (×3): 2 [IU] via SUBCUTANEOUS
  Filled 2021-05-28 (×3): qty 1

## 2021-05-28 MED ORDER — KETOROLAC TROMETHAMINE 30 MG/ML IJ SOLN: 30.0000 mg | Freq: Once | INTRAMUSCULAR | Status: DC

## 2021-05-28 MED ORDER — SODIUM CHLORIDE 0.9 % IV SOLN
100.0000 mg | Freq: Every day | INTRAVENOUS | Status: DC
  Administered 2021-05-29: 100 mg via INTRAVENOUS
  Filled 2021-05-28: qty 20

## 2021-05-28 MED ORDER — INSULIN GLARGINE-YFGN 100 UNIT/ML ~~LOC~~ SOLN
25.0000 [IU] | Freq: Every day | SUBCUTANEOUS | Status: DC
  Filled 2021-05-28 (×2): qty 0.25

## 2021-05-28 MED ORDER — ENOXAPARIN SODIUM 40 MG/0.4ML IJ SOSY
40.0000 mg | PREFILLED_SYRINGE | INTRAMUSCULAR | Status: DC
  Administered 2021-05-28: 40 mg via SUBCUTANEOUS
  Filled 2021-05-28: qty 0.4

## 2021-05-28 MED ORDER — POTASSIUM CHLORIDE CRYS ER 20 MEQ PO TBCR
20.0000 meq | EXTENDED_RELEASE_TABLET | Freq: Once | ORAL | Status: DC
  Filled 2021-05-28: qty 1

## 2021-05-28 MED ORDER — INSULIN ASPART 100 UNIT/ML IJ SOLN: 0.0000 [IU] | Freq: Three times a day (TID) | INTRAMUSCULAR | Status: DC

## 2021-05-28 MED ORDER — GABAPENTIN 100 MG PO CAPS
100.0000 mg | ORAL_CAPSULE | Freq: Three times a day (TID) | ORAL | Status: DC
  Administered 2021-05-28 – 2021-05-29 (×4): 100 mg via ORAL
  Filled 2021-05-28 (×4): qty 1

## 2021-05-28 MED ORDER — INSULIN GLARGINE-YFGN 100 UNIT/ML ~~LOC~~ SOLN
50.0000 [IU] | Freq: Every day | SUBCUTANEOUS | Status: DC
  Filled 2021-05-28: qty 0.5

## 2021-05-28 MED ORDER — POTASSIUM CHLORIDE CRYS ER 20 MEQ PO TBCR
40.0000 meq | EXTENDED_RELEASE_TABLET | Freq: Once | ORAL | Status: AC
  Administered 2021-05-28: 40 meq via ORAL
  Filled 2021-05-28: qty 2

## 2021-05-28 NOTE — H&P (Addendum)
History and Physical    Nathan Montoya YYT:[id number] DOB: Apr 19, 1981 DOA: 05/28/2021  PCP: [facility]  Patient coming from: Home.  Engineer, structural used.  Chief Complaint: Dizziness.  HPI: Nathan Montoya is a 40 y.o. male with history of diabetes mellitus type 2 presents to the ER with complaints of dizziness.  Patient has been having dizziness for the last 2 days.  Patient feels dizzy on standing.  He had passed out twice.  He states he may have passed out for a minute.  Denies any incontinence of urine.  Denies any chest pain palpitation shortness of breath.  Yesterday had 1 episode of diarrhea.  No new medications.  ED Course: In the ER patient was tachycardic initially which improved with fluids.  But patient on standing becomes orthostatic with blood pressure dropping less than 90 systolic.  Labs show sodium of 128 potassium of 3.4.  EKG showed sinus tachycardia.  Since patient is still orthostatic admitted for further observation and further fluids.  COVID test is pending.  Review of Systems: As per HPI, rest all negative.   Past Medical History:  Diagnosis Date   Diabetes mellitus without complication (HCC)     History reviewed. No pertinent surgical history.   reports that he has never smoked. He has never used smokeless tobacco. He reports that he does not drink alcohol and does not use drugs.  No Known Allergies  Family History  Problem Relation Age of Onset   Diabetes type II Mother     Prior to Admission medications   Medication Sig Start Date End Date Taking? Authorizing Provider  gabapentin (NEURONTIN) 100 MG capsule Take 1 capsule (100 mg total) by mouth 3 (three) times daily. Start with 1 capsule nightly for 2 weeks, then 1 capsule in the morning and at night for 2 weeks, and then increase to 1 capsule 3 times a day. 04/27/21 05/28/21 Yes Becky Augusta, NP  JANUMET 50-1000 MG tablet Take 1 tablet by mouth 2 (two) times daily.  02/21/21  Yes [provider]  LANTUS SOLOSTAR 100 UNIT/ML Solostar Pen Inject 50 Units into the skin daily. 02/21/21  Yes [provider]  metFORMIN (GLUCOPHAGE) 850 MG tablet Take 1 tablet (850 mg total) by mouth 2 (two) times daily with a meal. 02/17/21 05/28/21 Yes Dionne Bucy, MD    Physical Exam: Constitutional: Moderately built and nourished. Vitals:   05/28/21 0300 05/28/21 0345 05/28/21 0400 05/28/21 0430  BP: 132/85 (!) 93/58 120/87 111/83  Pulse: 99 (!) 121 (!) 102 (!) 101  Resp:  19    Temp:      TempSrc:  Oral    SpO2: 100% 99% 100% 100%   Eyes: Anicteric no pallor. ENMT: No discharge from the ears eyes nose or mouth. Neck: No mass felt.  No neck rigidity. Respiratory: No rhonchi or crepitations. Cardiovascular: S1-S2 heard. Abdomen: Soft nontender bowel sound present. Musculoskeletal: No edema.  Good pulses.  No obvious discoloration of the foot. Skin: No obvious discolorations of the foot. Neurologic: Alert awake oriented to time place and person.  Moves all extremities. Psychiatric: Appears normal.  Normal affect.   Labs on Admission: I have personally reviewed following labs and imaging studies  CBC: Recent Labs  Lab 05/28/21 0220  WBC 4.7  HGB 13.8  HCT RESULTS UNAVAILABLE DUE TO INTERFERING SUBSTANCE  MCV RESULTS UNAVAILABLE DUE TO INTERFERING SUBSTANCE  PLT 154   Basic Metabolic Panel: Recent Labs  Lab 05/28/21 0036  NA 128*  K 3.4*  CL 93*  CO2 25  GLUCOSE 134*  BUN 24*  CREATININE 1.07  CALCIUM 9.2   GFR: CrCl cannot be calculated (Unknown ideal weight.). Liver Function Tests: No results for input(s): AST, ALT, ALKPHOS, BILITOT, PROT, ALBUMIN in the last 168 hours. No results for input(s): LIPASE, AMYLASE in the last 168 hours. No results for input(s): AMMONIA in the last 168 hours. Coagulation Profile: No results for input(s): INR, PROTIME in the last 168 hours. Cardiac Enzymes: No results for input(s): CKTOTAL,  CKMB, CKMBINDEX, TROPONINI in the last 168 hours. BNP (last 3 results) No results for input(s): PROBNP in the last 8760 hours. HbA1C: No results for input(s): HGBA1C in the last 72 hours. CBG: Recent Labs  Lab 05/28/21 0042  GLUCAP 123*   Lipid Profile: No results for input(s): CHOL, HDL, LDLCALC, TRIG, CHOLHDL, LDLDIRECT in the last 72 hours. Thyroid Function Tests: No results for input(s): TSH, T4TOTAL, FREET4, T3FREE, THYROIDAB in the last 72 hours. Anemia Panel: No results for input(s): VITAMINB12, FOLATE, FERRITIN, TIBC, IRON, RETICCTPCT in the last 72 hours. Urine analysis:    Component Value Date/Time   COLORURINE YELLOW (A) 11/12/2016 0929   APPEARANCEUR CLEAR (A) 11/12/2016 0929   APPEARANCEUR CLEAR 08/01/2014 1132   LABSPEC 1.038 (H) 11/12/2016 0929   LABSPEC 1.010 08/01/2014 1132   PHURINE 5.0 11/12/2016 0929   GLUCOSEU >=500 (A) 11/12/2016 0929   GLUCOSEU 500 mg/dL 08/24/2535 6440   HGBUR NEGATIVE 11/12/2016 0929   BILIRUBINUR NEGATIVE 11/12/2016 0929   BILIRUBINUR NEGATIVE 08/01/2014 1132   KETONESUR 20 (A) 11/12/2016 0929   PROTEINUR 30 (A) 11/12/2016 0929   NITRITE NEGATIVE 11/12/2016 0929   LEUKOCYTESUR NEGATIVE 11/12/2016 0929   LEUKOCYTESUR NEGATIVE 08/01/2014 1132   Sepsis Labs: @LABRCNTIP (procalcitonin:4,lacticidven:4) )No results found for this or any previous visit (from the past 240 hour(s)).   Radiological Exams on Admission: No results found.  EKG: Independently reviewed.  Sinus tachycardia.  Assessment/Plan Principal Problem:   Orthostatic hypotension Active Problems:   Diabetes mellitus type 2 in nonobese [facility])    Syncope with orthostatic hypotension -cause not clear.  We will continue with hydration.  Check cortisol level TSH 2D echo.  Check orthostatic after hydration. Diabetes mellitus type 2 on Lantus and Janumet.  We will continue with Lantus with sliding scale coverage. Right foot pain -patient states he has been right foot pain  for the last 3 months.  On exam he has good pulses no discoloration or any signs of ischemia. Hyponatremia and hypokalemia -he did have 1 episode of diarrhea.  Not sure if this is causing the electrolyte changes.  Closely follow metabolic panel closely receiving LR.  Potassium replacement.  Addendum -patient's COVID test came back positive.  Patient is presently not hypoxic.  Will check chest x-ray and inflammatory markers and closely observe.  We will start patient on remdesivir.  Since patient is not hypoxic have not started patient on steroids.   DVT prophylaxis: Lovenox. Code Status: Full code. Family Communication: Discussed with patient. Disposition Plan: Home when stable. Consults called: None. Admission status: Observation.   [facility] MD [facility] Pager [phone].  If 7PM-7AM, please contact night-coverage [email or web address] Password [facility]  05/28/2021, 4:58 AM

## 2021-05-28 NOTE — Progress Notes (Signed)
[facility] night shift.  The staff reported the patient was having 10 out of 10 foot pain and only has gabapentin for it.  Toradol 30 mg IVP x1 and acetaminophen as needed ordered.  Sanda Klein, MD.

## 2021-05-28 NOTE — ED Provider Notes (Addendum)
[facility] Emergency Department Provider Note  ____________________________________________  Time seen: Approximately 12:12 AM  I have reviewed the triage vital signs and the nursing notes.   HISTORY  Chief Complaint No chief complaint on file.   HPI Nathan Montoya is a 40 y.o. male with a history of type 2 diabetes who presents for evaluation of dizziness and syncope.  Patient reports over the last 2 days he has been feeling lightheaded and passed out twice.  Especially when he stands up.  He reports that he feels like his heart is racing.  He denies any recent illnesses, reports that he has been eating and drinking well, denies being out in the heat for extended period of time.  He denies chest pain or shortness of breath, fever or chills, vomiting or diarrhea, abdominal pain.  Patient denies any changes in vision but reports that his vision blacked out when he passed out.  Also reports bilateral leg pain which he has had for several years attributed to his neuropathy.  He denies any changes on that pain.  Past Medical History:  Diagnosis Date   Diabetes mellitus without complication (HCC)    Prior to Admission medications   Medication Sig Start Date End Date Taking? Authorizing Provider  gabapentin (NEURONTIN) 100 MG capsule Take 1 capsule (100 mg total) by mouth 3 (three) times daily. Start with 1 capsule nightly for 2 weeks, then 1 capsule in the morning and at night for 2 weeks, and then increase to 1 capsule 3 times a day. 04/27/21 05/28/21 Yes Becky Augusta, NP  JANUMET 50-1000 MG tablet Take 1 tablet by mouth 2 (two) times daily. 02/21/21  Yes [provider]  LANTUS SOLOSTAR 100 UNIT/ML Solostar Pen Inject 50 Units into the skin daily. 02/21/21  Yes [provider]  metFORMIN (GLUCOPHAGE) 850 MG tablet Take 1 tablet (850 mg total) by mouth 2 (two) times daily with a meal. 02/17/21 05/28/21 Yes Dionne Bucy, MD    Allergies Patient  has no known allergies.  Family History  Problem Relation Age of Onset   Diabetes type II Mother     Social History Social History   Tobacco Use   Smoking status: Never   Smokeless tobacco: Never  Substance Use Topics   Alcohol use: Never   Drug use: Never    Review of Systems  Constitutional: Negative for fever. + Lightheadedness and syncope Eyes: Negative for visual changes. ENT: Negative for sore throat. Neck: No neck pain  Cardiovascular: Negative for chest pain. Respiratory: Negative for shortness of breath. Gastrointestinal: Negative for abdominal pain, vomiting or diarrhea. Genitourinary: Negative for dysuria. Musculoskeletal: Negative for back pain. + b/l leg pain Skin: Negative for rash. Neurological: Negative for headaches, weakness or numbness. Psych: No SI or HI  ____________________________________________   PHYSICAL EXAM:  VITAL SIGNS: ED Triage Vitals  Enc Vitals Group     BP 05/27/21 2350 91/61     Pulse Rate 05/27/21 2350 (!) 138     Resp 05/27/21 2350 16     Temp 05/27/21 2350 98 F (36.7 C)     Temp Source 05/27/21 2350 Oral     SpO2 05/27/21 2350 97 %     Weight --      Height --      Head Circumference --      Peak Flow --      Pain Score 05/27/21 2354 7     Pain Loc --  Pain Edu? --      Excl. in GC? --     Constitutional: Alert and oriented. Well appearing and in no apparent distress. HEENT:      Head: Normocephalic and atraumatic.         Eyes: Conjunctivae are normal. Sclera is non-icteric.       Mouth/Throat: Mucous membranes are dry.       Neck: Supple with no signs of meningismus. Cardiovascular: Tachycardic and regular rhythm. Respiratory: Normal respiratory effort. Lungs are clear to auscultation bilaterally.  Gastrointestinal: Soft, non tender, and non distended. Musculoskeletal:  No edema, cyanosis, or erythema of extremities. Neurologic: Normal speech and language. Face is symmetric. Moving all extremities. No  gross focal neurologic deficits are appreciated. Skin: Skin is warm, dry and intact. No rash noted. Psychiatric: Mood and affect are normal. Speech and behavior are normal.  ____________________________________________   LABS (all labs ordered are listed, but only abnormal results are displayed)  Labs Reviewed  RESP PANEL BY RT-PCR (FLU A&B, COVID) ARPGX2 - Abnormal; Notable for the following components:      Result Value   SARS Coronavirus 2 by RT PCR POSITIVE (*)    All other components within normal limits  BASIC METABOLIC PANEL - Abnormal; Notable for the following components:   Sodium 128 (*)    Potassium 3.4 (*)    Chloride 93 (*)    Glucose, Bld 134 (*)    BUN 24 (*)    All other components within normal limits  CBG MONITORING, ED - Abnormal; Notable for the following components:   Glucose-Capillary 123 (*)    All other components within normal limits  CBC  URINALYSIS, COMPLETE (UACMP) WITH MICROSCOPIC  HEMOGLOBIN A1C  HIV ANTIBODY (ROUTINE TESTING W REFLEX)  CREATININE, SERUM  TSH  CORTISOL  TROPONIN I (HIGH SENSITIVITY)   ____________________________________________  EKG  ED ECG REPORT I, Nita Sickle, the attending physician, personally viewed and interpreted this ECG.  Sinus tachycardia, rate of 133, normal intervals, normal axis, no ST elevations or depressions. ____________________________________________  RADIOLOGY  none  ____________________________________________   PROCEDURES  Procedure(s) performed:yes .1-3 Lead EKG Interpretation  Date/Time: 05/28/2021 3:47 AM Performed by: Nita Sickle, MD Authorized by: Nita Sickle, MD     Interpretation: non-specific     ECG rate assessment: tachycardic     Rhythm: sinus tachycardia     Ectopy: none     Conduction: normal    Critical Care performed:  None ____________________________________________   INITIAL IMPRESSION / ASSESSMENT AND PLAN / ED COURSE  40 y.o. male with a  history of type 2 diabetes who presents for evaluation of dizziness and syncope.  Patient looks dry on exam, with normal work of breathing and normal sats, tachycardic and hypotensive, afebrile, otherwise in no obvious distress.  He has full painless range of motion of all joints in the lower extremities with no erythema, no edema, no cyanosis, no rash.  EKG showing sinus tachycardia.  Differential diagnoses including dehydration versus DKA versus sepsis versus anemia versus AKI.  With no chest pain or shortness of breath less likely PE.  Will give IV fluids, check basic blood work.  History gathered from patient his wife is at bedside.  Plan discussed with both of them.  Patient placed on telemetry for close monitoring of cardiorespiratory status.  Old medical records reviewed.  _________________________ 3:47 AM on 05/28/2021 ----------------------------------------- Labs with no signs of DKA, AKI, sepsis, or anemia. Lab with difficulty running patient's CBC due to interfering  substance.  They were able to give [place] the result of a WBC, hemoglobin and platelets.  The lab technician is unsure of the reason for the other results are not be able to be calculated. Chemistry panel showed a glucose of 134 with mild hyponatremia sodium of 128.  K of 3.4 which is supplemented p.o.  Patient was given 2 L of fluid with improvement of his vital signs however orthostatic vital signs were done and patient's pressure dropped to 62 systolic and heart rate went up to 120 with standing.  Patient felt very dizzy.  We will start patient on maintenance fluids and discuss with the hospitalist for admission. Patient still with no UOP  _________________________ 5:09 AM on 05/28/2021 ----------------------------------------- Patient is covid +    _____________________________________________ Please note:  Patient was evaluated in Emergency Department today for the symptoms described in the history of present illness. Patient  was evaluated in the context of the global COVID-19 pandemic, which necessitated consideration that the patient might be at risk for infection with the SARS-CoV-2 virus that causes COVID-19. Institutional protocols and algorithms that pertain to the evaluation of patients at risk for COVID-19 are in a state of rapid change based on information released by regulatory bodies including the CDC and federal and state organizations. These policies and algorithms were followed during the patient's care in the ED.  Some ED evaluations and interventions may be delayed as a result of limited staffing during the pandemic.   [place] Controlled Substance Database was reviewed by me. ____________________________________________   FINAL CLINICAL IMPRESSION(S) / ED DIAGNOSES   Final diagnoses:  Orthostatic hypotension  Syncope, unspecified syncope type  COVID-19      NEW MEDICATIONS STARTED DURING THIS VISIT:  ED Discharge Orders     None        Note:  This document was prepared using Dragon voice recognition software and may include unintentional dictation errors.    Don Perking, [place], MD 05/28/21 0454    Nita Sickle, MD 05/28/21 [phone]

## 2021-05-28 NOTE — Progress Notes (Signed)
PROGRESS NOTE    Nathan Montoya  JQB:[id number] DOB: 06/25/1981 DOA: 05/28/2021 PCP: [facility]   Chief complaint.  Syncope. Brief Narrative:  Nathan Montoya is a 40 y.o. male with history of diabetes mellitus type 2 presents to the ER with complaints of dizziness.  Patient has been having dizziness for the last 2 days.  Patient feels dizzy on standing.  He had passed out twice.  He states he may have passed out for a minute.  Patient states that that he had 6 loose stools yesterday, his COVID test came back positive.  He was given fluids for hyponatremia.   Assessment & Plan:   Principal Problem:   Orthostatic hypotension Active Problems:   Diabetes mellitus type 2 in nonobese (HCC)  #1.  Syncope with orthostatic hypotension. This appears to be secondary to dehydration.  Patient has significant hyponatremia with recent diarrhea.  COVID-positive. Troponin negative, I personally reviewed EKG, sinus tachycardia with RBBB, no QT interval prolongation.  Echocardiogram is pending.  Suspicion for myocarditis is low.  No valvular disease per physical examination. I will continue to rehydrate with normal saline.  2.  Hyponatremia. Hypokalemia. This secondary to dehydration from diarrhea. Fluids.  Recheck a BMP tomorrow.  #3.  Type 2 diabetes. Continue Lantus and sliding scale coverage  #4.  COVID infection. Viral gastroenteritis  Patient does not have any hypoxemia or pneumonia.  Follow.   DVT prophylaxis: Lovenox Code Status: full Family Communication:  Disposition Plan:    Status is: Observation    Dispo: The patient is from: Home              Anticipated d/c is to: Home              Patient currently is not medically stable to d/c.   Difficult to place patient No        I/O last 3 completed shifts: In: 2264.4 [IV Piggyback:2264.4] Out: -  No intake/output data recorded.     Consultants:  None  Procedures:  None  Antimicrobials: None   Subjective: Seen patient was assisted from virtual Spanish interpreter. Currently patient doing well, no additional nausea vomiting. No dizziness lightheadedness. No additional diarrhea No fever chills per No short of breath or cough. No chest pain or palpitation.  Objective: Vitals:   05/28/21 0430 05/28/21 0500 05/28/21 0819 05/28/21 0900  BP: 111/83 117/87 119/87   Pulse: (!) 101 (!) 107 93   Resp:   18   Temp:   97.8 F (36.6 C)   TempSrc:      SpO2: 100% 100% 100%   Weight:    54.4 kg    Intake/Output Summary (Last 24 hours) at 05/28/2021 1050 Last data filed at 05/28/2021 0345 Gross per 24 hour  Intake 2264.4 ml  Output --  Net 2264.4 ml   Filed Weights   05/28/21 0900  Weight: 54.4 kg    Examination:  General exam: Appears calm and comfortable  Respiratory system: Clear to auscultation. Respiratory effort normal. Cardiovascular system: S1 & S2 heard, RRR. No JVD, murmurs, rubs, gallops or clicks. No pedal edema. Gastrointestinal system: Abdomen is nondistended, soft and nontender. No organomegaly or masses felt. Normal bowel sounds heard. Central nervous system: Alert and oriented. No focal neurological deficits. Extremities: Symmetric 5 x 5 power. Skin: No rashes, lesions or ulcers Psychiatry: Judgement and insight appear normal. Mood & affect appropriate.     Data Reviewed: I have personally reviewed following labs and imaging  studies  CBC: Recent Labs  Lab 05/28/21 0220  WBC 4.7  HGB 13.8  HCT RESULTS UNAVAILABLE DUE TO INTERFERING SUBSTANCE  MCV RESULTS UNAVAILABLE DUE TO INTERFERING SUBSTANCE  PLT 154   Basic Metabolic Panel: Recent Labs  Lab 05/28/21 0036 05/28/21 0727  NA 128* 131*  K 3.4* 3.8  CL 93* 99  CO2 25 25  GLUCOSE 134* 89  BUN 24* 17  CREATININE 1.07 0.67  CALCIUM 9.2 8.3*   GFR: Estimated Creatinine Clearance: 94.4 mL/min (by C-G formula based on SCr of 0.67 mg/dL). Liver Function  Tests: No results for input(s): AST, ALT, ALKPHOS, BILITOT, PROT, ALBUMIN in the last 168 hours. No results for input(s): LIPASE, AMYLASE in the last 168 hours. No results for input(s): AMMONIA in the last 168 hours. Coagulation Profile: No results for input(s): INR, PROTIME in the last 168 hours. Cardiac Enzymes: No results for input(s): CKTOTAL, CKMB, CKMBINDEX, TROPONINI in the last 168 hours. BNP (last 3 results) No results for input(s): PROBNP in the last 8760 hours. HbA1C: No results for input(s): HGBA1C in the last 72 hours. CBG: Recent Labs  Lab 05/28/21 0042 05/28/21 0821  GLUCAP 123* 77   Lipid Profile: No results for input(s): CHOL, HDL, LDLCALC, TRIG, CHOLHDL, LDLDIRECT in the last 72 hours. Thyroid Function Tests: Recent Labs    05/28/21 0727  TSH 1.365   Anemia Panel: No results for input(s): VITAMINB12, FOLATE, FERRITIN, TIBC, IRON, RETICCTPCT in the last 72 hours. Sepsis Labs: No results for input(s): PROCALCITON, LATICACIDVEN in the last 168 hours.  Recent Results (from the past 240 hour(s))  Resp Panel by RT-PCR (Flu A&B, Covid) Nasopharyngeal Swab     Status: Abnormal   Collection Time: 05/28/21  4:01 AM   Specimen: Nasopharyngeal Swab; Nasopharyngeal(NP) swabs in vial transport medium  Result Value Ref Range Status   SARS Coronavirus 2 by RT PCR POSITIVE (A) NEGATIVE Final    Comment: RESULT CALLED TO, READ BACK BY AND VERIFIED WITH: JUSTIN STROWBRIDGE @ 0507 05/28/2021 LFD (NOTE) SARS-CoV-2 target nucleic acids are DETECTED.  The SARS-CoV-2 RNA is generally detectable in upper respiratory specimens during the acute phase of infection. Positive results are indicative of the presence of the identified virus, but do not rule out bacterial infection or co-infection with other pathogens not detected by the test. Clinical correlation with patient history and other diagnostic information is necessary to determine patient infection status. The expected  result is Negative.  Fact Sheet for Patients: BloggerCourse.com  Fact Sheet for Healthcare Providers: SeriousBroker.it  This test is not yet approved or cleared by the [place] FDA and  has been authorized for detection and/or diagnosis of SARS-CoV-2 by FDA under an Emergency Use Authorization (EUA).  This EUA will remain in effect (meaning this test  can be used) for the duration of  the COVID-19 declaration under Section 564(b)(1) of the Act, 21 U.S.C. section 360bbb-3(b)(1), unless the authorization is terminated or revoked sooner.     Influenza A by PCR NEGATIVE NEGATIVE Final   Influenza B by PCR NEGATIVE NEGATIVE Final    Comment: (NOTE) The Xpert Xpress SARS-CoV-2/FLU/RSV plus assay is intended as an aid in the diagnosis of influenza from Nasopharyngeal swab specimens and should not be used as a sole basis for treatment. Nasal washings and aspirates are unacceptable for Xpert Xpress SARS-CoV-2/FLU/RSV testing.  Fact Sheet for Patients: BloggerCourse.com  Fact Sheet for Healthcare Providers: SeriousBroker.it  This test is not yet approved or cleared by the [place] FDA  and has been authorized for detection and/or diagnosis of SARS-CoV-2 by FDA under an Emergency Use Authorization (EUA). This EUA will remain in effect (meaning this test can be used) for the duration of the COVID-19 declaration under Section 564(b)(1) of the Act, 21 U.S.C. section 360bbb-3(b)(1), unless the authorization is terminated or revoked.  Performed at [facility], [address] [place], [place] [address]          Radiology Studies: [facility] Chest [place] 1 View  Result Date: 05/28/2021 CLINICAL DATA:  COVID. EXAM: PORTABLE CHEST 1 VIEW COMPARISON:  None. FINDINGS: Patchy infiltrate best seen at the right base. No edema, effusion, or pneumothorax. Normal heart size and  mediastinal contours. IMPRESSION: Patchy pneumonia best seen at the right base. Electronically Signed   By: Marnee Spring M.D.   On: 05/28/2021 05:47        Scheduled Meds:  enoxaparin (LOVENOX) injection  40 mg Subcutaneous Q24H   gabapentin  100 mg Oral TID   insulin aspart  0-15 Units Subcutaneous TID WC   insulin glargine-yfgn  25 Units Subcutaneous QHS   potassium chloride  20 mEq Oral Once   Continuous Infusions:  lactated ringers 125 mL/hr at 05/28/21 0835   [START ON 05/29/2021] remdesivir 100 mg in NS 100 mL       LOS: 0 days    Time spent:     Marrion Coy, MD [facility]   To contact the attending provider between 7A-7P or the covering provider during after hours 7P-7A, please log into the web site [email or web address] and access using universal [place] password for that web site. If you do not have the password, please call the hospital operator.  05/28/2021, 10:50 AM

## 2021-05-28 NOTE — Consult Note (Signed)
Remdesivir - Pharmacy Brief Note   O:  ALT: 13 CXR: Patchy pneumonia best seen at the right base SpO2: 100% on RA   A/P:  Remdesivir 200 mg IVPB once followed by 100 mg IVPB daily x 4 days.   Doloris Hall, PharmD Pharmacy Resident  05/29/2021 9:50 AM

## 2021-05-28 NOTE — Progress Notes (Signed)
*  PRELIMINARY RESULTS* Echocardiogram 2D Echocardiogram has been performed.  Joanette Gula Courtnie Brenes 05/28/2021, 2:08 PM

## 2021-05-29 DIAGNOSIS — U071 COVID-19: Secondary | ICD-10-CM

## 2021-05-29 DIAGNOSIS — E871 Hypo-osmolality and hyponatremia: Secondary | ICD-10-CM

## 2021-05-29 LAB — CBC WITH DIFFERENTIAL/PLATELET
Abs Immature Granulocytes: 0.03 10*3/uL (ref 0.00–0.07)
Basophils Absolute: 0 10*3/uL (ref 0.0–0.1)
Basophils Relative: 0 %
Eosinophils Absolute: 0 10*3/uL (ref 0.0–0.5)
Eosinophils Relative: 0 %
HCT: 31.7 % — ABNORMAL LOW (ref 39.0–52.0)
Hemoglobin: 11.8 g/dL — ABNORMAL LOW (ref 13.0–17.0)
Immature Granulocytes: 1 %
Lymphocytes Relative: 29 %
Lymphs Abs: 1.1 10*3/uL (ref 0.7–4.0)
MCH: 30.7 pg (ref 26.0–34.0)
MCHC: 37.2 g/dL — ABNORMAL HIGH (ref 30.0–36.0)
MCV: 82.6 fL (ref 80.0–100.0)
Monocytes Absolute: 0.8 10*3/uL (ref 0.1–1.0)
Monocytes Relative: 22 %
Neutro Abs: 1.7 10*3/uL (ref 1.7–7.7)
Neutrophils Relative %: 48 %
Platelets: 166 10*3/uL (ref 150–400)
RBC: 3.84 MIL/uL — ABNORMAL LOW (ref 4.22–5.81)
RDW: 10.8 % — ABNORMAL LOW (ref 11.5–15.5)
WBC: 3.7 10*3/uL — ABNORMAL LOW (ref 4.0–10.5)
nRBC: 0 % (ref 0.0–0.2)

## 2021-05-29 LAB — COMPREHENSIVE METABOLIC PANEL
ALT: 13 U/L (ref 0–44)
AST: 16 U/L (ref 15–41)
Albumin: 3 g/dL — ABNORMAL LOW (ref 3.5–5.0)
Alkaline Phosphatase: 59 U/L (ref 38–126)
Anion gap: 7 (ref 5–15)
BUN: 11 mg/dL (ref 6–20)
CO2: 24 mmol/L (ref 22–32)
Calcium: 8.5 mg/dL — ABNORMAL LOW (ref 8.9–10.3)
Chloride: 102 mmol/L (ref 98–111)
Creatinine, Ser: 0.56 mg/dL — ABNORMAL LOW (ref 0.61–1.24)
GFR, Estimated: >60 mL/min (ref >60)
Glucose, Bld: 80 mg/dL (ref 70–99)
Potassium: 3 mmol/L — ABNORMAL LOW (ref 3.5–5.1)
Sodium: 133 mmol/L — ABNORMAL LOW (ref 135–145)
Total Bilirubin: 0.5 mg/dL (ref 0.3–1.2)
Total Protein: 6.7 g/dL (ref 6.5–8.1)

## 2021-05-29 LAB — D-DIMER, QUANTITATIVE: D-Dimer, Quant: 0.66 ug/mL-FEU — ABNORMAL HIGH (ref 0.00–0.50)

## 2021-05-29 LAB — GLUCOSE, CAPILLARY
Glucose-Capillary: 141 mg/dL — ABNORMAL HIGH (ref 70–99)
Glucose-Capillary: 141 mg/dL — ABNORMAL HIGH (ref 70–99)

## 2021-05-29 LAB — C-REACTIVE PROTEIN: CRP: 0.8 mg/dL (ref <1.0)

## 2021-05-29 LAB — PROCALCITONIN: Procalcitonin: <0.1 ng/mL

## 2021-05-29 LAB — MAGNESIUM: Magnesium: 1.5 mg/dL — ABNORMAL LOW (ref 1.7–2.4)

## 2021-05-29 MED ORDER — POTASSIUM CHLORIDE 10 MEQ/100ML IV SOLN
10.0000 meq | INTRAVENOUS | Status: AC
  Administered 2021-05-29 (×3): 10 meq via INTRAVENOUS
  Filled 2021-05-29 (×3): qty 100

## 2021-05-29 MED ORDER — MAGNESIUM SULFATE 2 GM/50ML IV SOLN
2.0000 g | Freq: Once | INTRAVENOUS | Status: AC
  Administered 2021-05-29: 2 g via INTRAVENOUS
  Filled 2021-05-29: qty 50

## 2021-05-29 NOTE — Discharge Summary (Signed)
Physician Discharge Summary  Patient ID: Nathan Montoya MRN: [id number] DOB/AGE: Apr 06, 1981 40 y.o.  Admit date: 05/28/2021 Discharge date: 05/29/2021  Admission Diagnoses:  Discharge Diagnoses:  Principal Problem:   Orthostatic hypotension Active Problems:   Diabetes mellitus type 2 in nonobese (HCC)   Hyponatremia   COVID-19 virus infection   Discharged Condition: good  Hospital Course:  Nathan Montoya is a 40 y.o. male with history of diabetes mellitus type 2 presents to the ER with complaints of dizziness.  Patient has been having dizziness for the last 2 days.  Patient feels dizzy on standing.  He had passed out twice.  He states he may have passed out for a minute.  Patient states that that he had 6 loose stools yesterday, his COVID test came back positive.  He was given fluids for hyponatremia.  #1.  Syncope with orthostatic hypotension. This appears to be secondary to dehydration.  Patient has significant hyponatremia with recent diarrhea.  COVID-positive. Troponin negative, I personally reviewed EKG, sinus tachycardia with RBBB, no QT interval prolongation. Suspicion for myocarditis is low.  No valvular disease per physical examination. Echocardiogram showed ejection fraction 50 to 55%, borderline low, diastolic dysfunction.  Patient currently does not have any evidence of volume overload.  Patient be referred to cardiology in 2 weeks. Patient will be followed with PCP in 1 week.  2.  Hyponatremia. Hypokalemia. Hypomagnesemia This secondary to dehydration from diarrhea. Sodium level improved.  We will continue given potassium and magnesium.  Discontinue fluids.  Patient can be discharged from this point.   #3.  Type 2 diabetes. Resume home medicines.   #4.  COVID infection. Viral gastroenteritis  Right lower lobe viral pneumonia secondary to COVID. Patient does not have any hypoxemia, received 2 doses of remdesivir. Procalcitonin level less than 0.1, no  bacterial infection.  Patient currently does not have any respiratory symptoms.  No indication for steroids.    Consults: None  Significant Diagnostic Studies:   PORTABLE CHEST 1 VIEW   COMPARISON:  None.   FINDINGS: Patchy infiltrate best seen at the right base. No edema, effusion, or pneumothorax. Normal heart size and mediastinal contours.   IMPRESSION: Patchy pneumonia best seen at the right base.     Electronically Signed   By: Marnee Spring M.D.   On: 05/28/2021 05:47  Echo: 1. Left ventricular ejection fraction, by estimation, is 50 to 55%. The left ventricle has low normal function. The left ventricle has no regional wall motion abnormalities. There is mild concentric left ventricular hypertrophy. Left ventricular diastolic parameters are consistent with Grade I diastolic dysfunction (impaired relaxation). 2. Right ventricular systolic function is normal. The right ventricular size is normal. 3. Left atrial size was mildly dilated. 4. Right atrial size was mildly dilated. 5. The mitral valve is normal in structure. Mild mitral valve regurgitation. No evidence of mitral stenosis. 6. The aortic valve is normal in structure. Aortic valve regurgitation is not visualized. No aortic stenosis is present. 7. The inferior vena cava is normal in size with greater than 50% respiratory variability, suggesting right atrial pressure of 3 mmHg.  Treatments: IVF, remdesivir.  Discharge Exam: Blood pressure 113/88, pulse 82, temperature 97.8 F (36.6 C), temperature source Oral, resp. rate 17, weight 54.4 kg, SpO2 100 %. General appearance: alert and cooperative Resp: Mild crackles in the right lower lobe. Cardio: regular rate and rhythm, S1, S2 normal, no murmur, click, rub or gallop GI: soft, non-tender; bowel sounds normal; no masses,  no  organomegaly Extremities: extremities normal, atraumatic, no cyanosis or edema  Disposition: Discharge disposition: 01-Home or Self  Care       Discharge Instructions     Diet - low sodium heart healthy   Complete by: As directed    Increase activity slowly   Complete by: As directed       Allergies as of 05/29/2021   No Known Allergies      Medication List     TAKE these medications    gabapentin 100 MG capsule Commonly known as: Neurontin Take 1 capsule (100 mg total) by mouth 3 (three) times daily. Start with 1 capsule nightly for 2 weeks, then 1 capsule in the morning and at night for 2 weeks, and then increase to 1 capsule 3 times a day.   Janumet 50-1000 MG tablet Generic drug: sitaGLIPtin-metformin Take 1 tablet by mouth 2 (two) times daily.   Lantus SoloStar 100 UNIT/ML Solostar Pen Generic drug: insulin glargine Inject 50 Units into the skin daily.   metFORMIN 850 MG tablet Commonly known as: Glucophage Take 1 tablet (850 mg total) by mouth 2 (two) times daily with a meal.        Follow-up Information     [facility]. Schedule an appointment as soon as possible for a visit in 1 week(s).   Specialty: General Practice Contact information: [address] [place] [address] [phone]         Dalia Heading, MD. Schedule an appointment as soon as possible for a visit in 2 week(s).   Specialty: Cardiology Contact information: [address] [place] [address] [phone]                32 minutes Signed: Marrion Coy 05/29/2021, 12:22 PM

## 2021-05-29 NOTE — Progress Notes (Signed)
Received Md order to discharge patient to home, reviewed homes meds, discharge instructions and follow up appointments with patient through interpreter and patient verbalized understanding

## 2022-09-21 IMAGING — CR DG KNEE COMPLETE 4+V*R*
4 series · 4 of 4 positions shown · non-contrast
Comparison: None.

CLINICAL DATA: Knee pain for 3 weeks

EXAM:
RIGHT KNEE - COMPLETE 4+ VIEW

[knee ap]
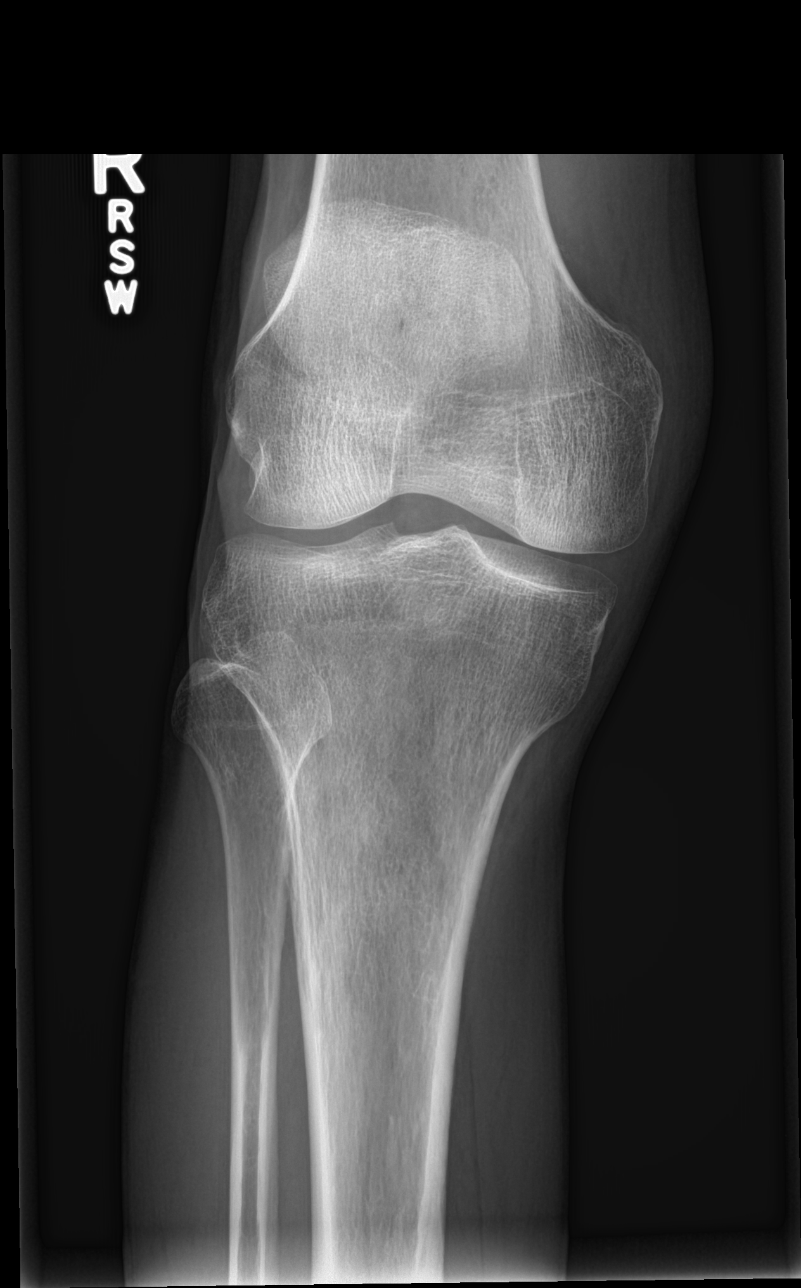

[knee lat]
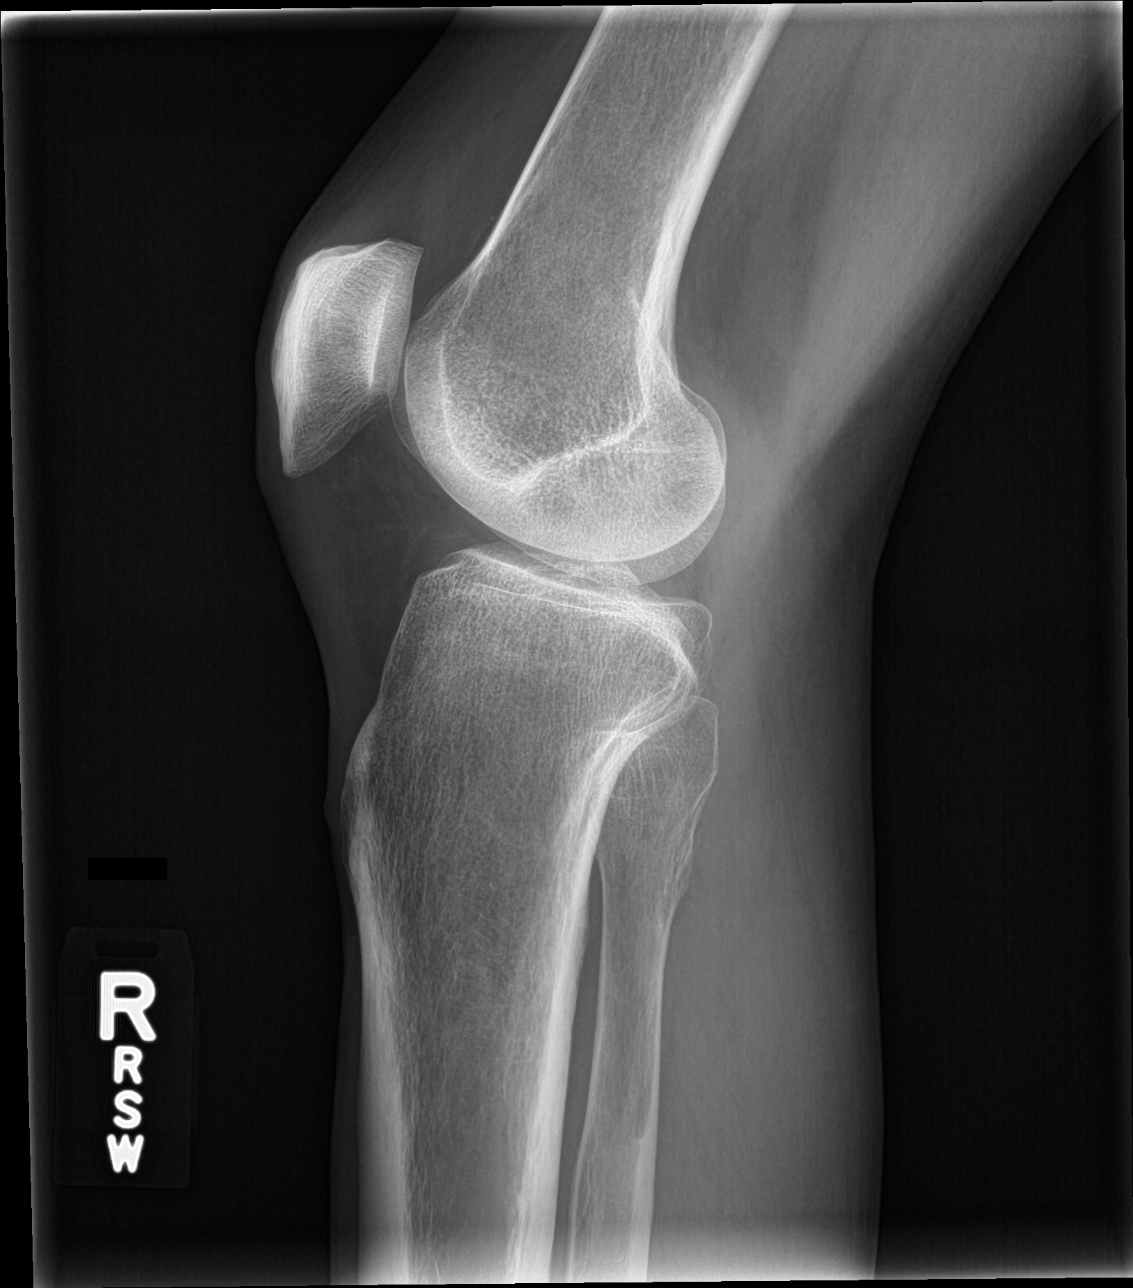

[tunnel]
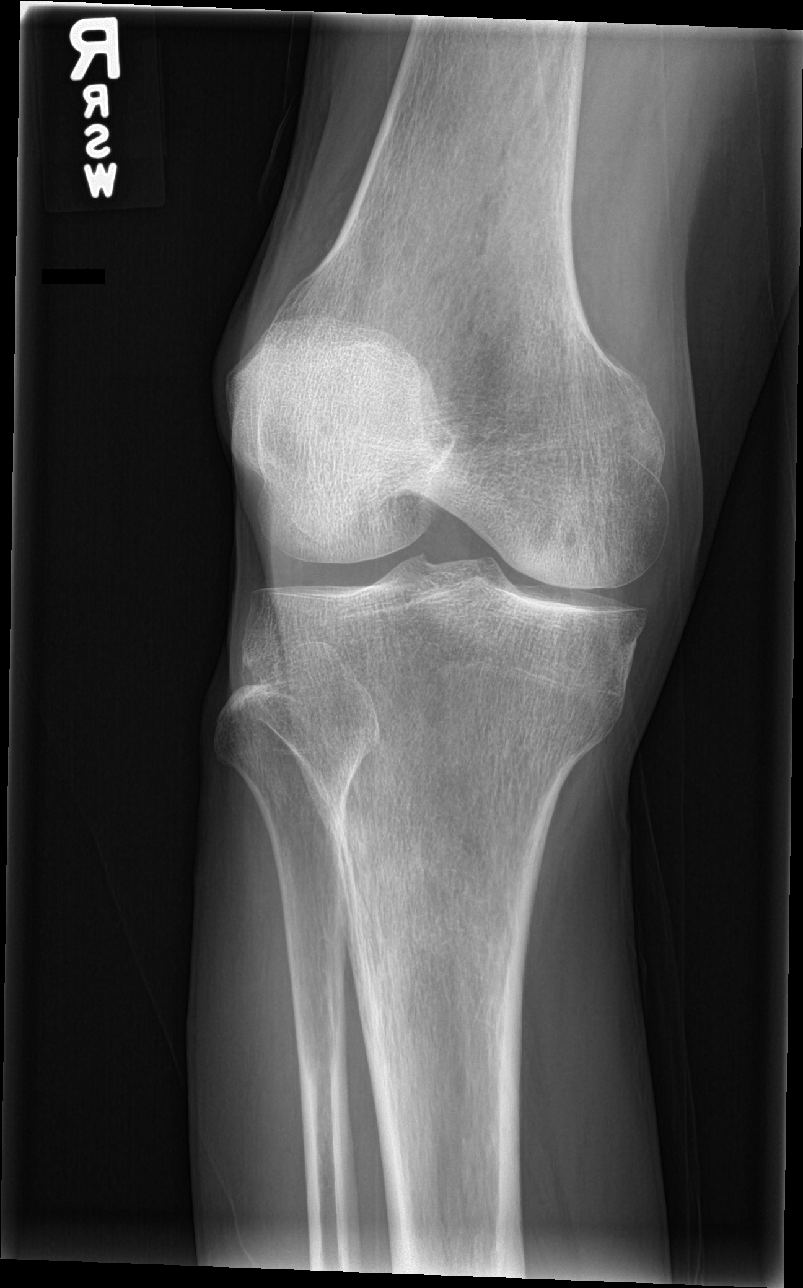

[patella skyline]
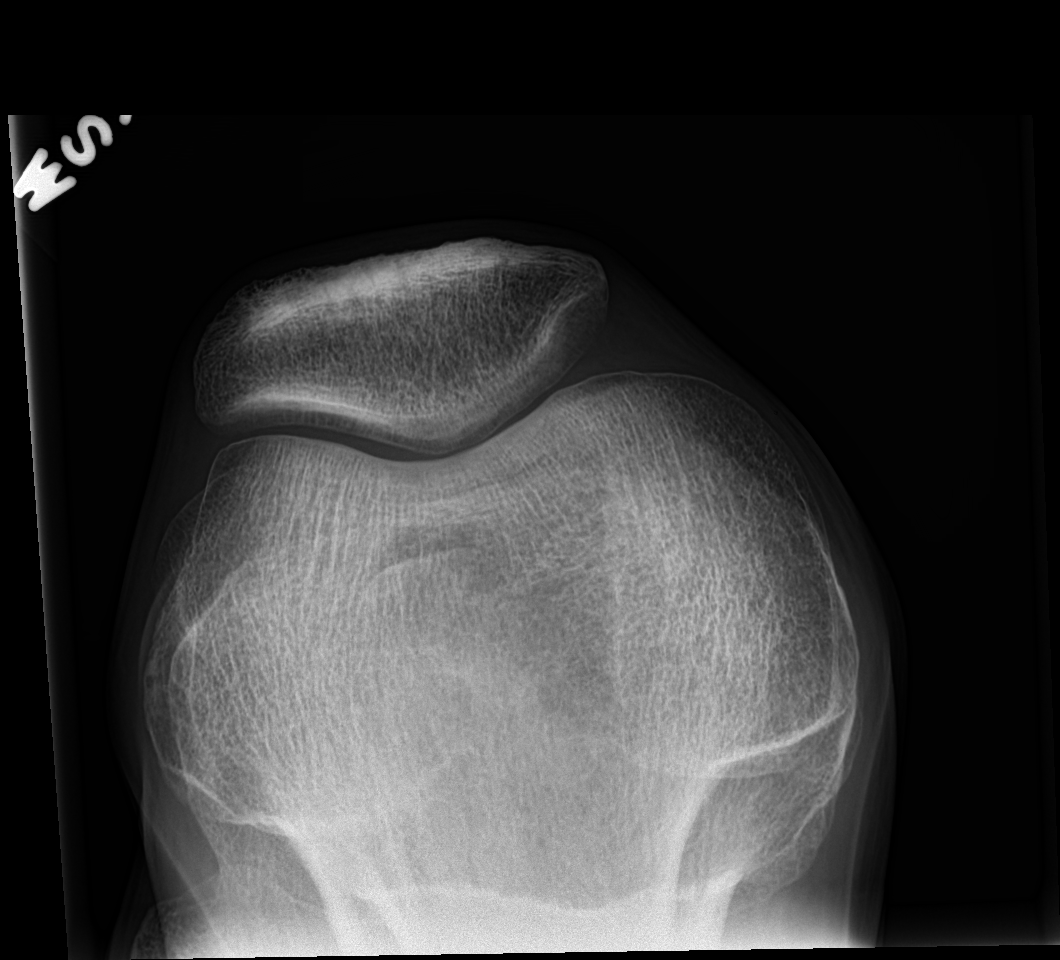

[4 of 4 positions shown; findings below may reference images not displayed]

FINDINGS: No evidence of fracture, dislocation, or joint effusion. No evidence
of arthropathy or other focal bone abnormality. Soft tissues are
unremarkable.
IMPRESSION: Negative.
